# Patient Record
Sex: Female | Born: 1960 | Race: Black or African American | Hispanic: No | Marital: Married | Smoking: Former smoker
Health system: Southern US, Community
[De-identification: ages and names within clinical notes are randomized; demographics above are authoritative.]

## PROBLEM LIST (undated history)

## (undated) DIAGNOSIS — I10 Essential (primary) hypertension: Secondary | ICD-10-CM

## (undated) DIAGNOSIS — E119 Type 2 diabetes mellitus without complications: Secondary | ICD-10-CM

## (undated) HISTORY — PX: CHOLECYSTECTOMY: SHX55

## (undated) HISTORY — DX: Essential (primary) hypertension: I10

## (undated) HISTORY — DX: Type 2 diabetes mellitus without complications: E11.9

## (undated) HISTORY — PX: APPENDECTOMY: SHX54

---

## 2004-01-15 HISTORY — PX: SHOULDER ARTHROSCOPY W/ ROTATOR CUFF REPAIR: SHX2400

## 2011-02-26 ENCOUNTER — Ambulatory Visit: Payer: Self-pay | Admitting: Family Medicine

## 2014-12-21 ENCOUNTER — Encounter: Payer: Self-pay | Admitting: *Deleted

## 2014-12-21 ENCOUNTER — Encounter: Payer: BLUE CROSS/BLUE SHIELD | Attending: Family Medicine | Admitting: *Deleted

## 2014-12-21 ENCOUNTER — Telehealth: Payer: Self-pay | Admitting: *Deleted

## 2014-12-21 VITALS — BP 118/80 | Ht 62.0 in | Wt 153.6 lb

## 2014-12-21 DIAGNOSIS — E119 Type 2 diabetes mellitus without complications: Secondary | ICD-10-CM | POA: Insufficient documentation

## 2014-12-21 NOTE — Telephone Encounter (Signed)
Phone call made to Dr Clemmie Krill' office regarding pt's blood sugar of 399 mg/dL. Spoke with Sam. Asked her to fax A1C and pt's correct medication list. Also asked their office to call in prescription for strips and lancets for One Touch Ultra Mini meter. Received medication list and noted that patient is supposed to be on Metformin 2 x day. She reported only taking 1 x day. Phone call made to patient and left message regarding frequency.

## 2014-12-21 NOTE — Patient Instructions (Addendum)
Check blood sugars 2 x day before breakfast and 2 hrs after supper every day Exercise: Walk  for    45  minutes   4  days a week Avoid strenuous exercise until blood sugars less than 250 mg/dL Eat 3 meals day,   1  snacks a day Space meals 4-6 hours apart Don't skip meals Avoid sugar sweetened drinks (coffee, tea) Drink plenty of water Bring blood sugar records to the next class Call your doctor for a prescription for:  1. Meter strips (type) One Touch Ultra Blue checking  2  times per day  2. Lancets (type) One Touch Delica checking  2   times per day

## 2014-12-21 NOTE — Progress Notes (Addendum)
Diabetes Self-Management Education  Visit Type: First/Initial  Appt. Start Time: 0830 Appt. End Time: 0945  12/21/2014  [name], identified by name and date of birth, is a 54 y.o. female with a diagnosis of Diabetes: Type 2.   ASSESSMENT  Blood pressure 118/80, height 5\' 2"  (1.575 m), weight 153 lb 9.6 oz (69.673 kg). Body mass index is 28.09 kg/(m^2).      Diabetes Self-Management Education - 12/21/14 1044    Visit Information   Visit Type First/Initial   Initial Visit   Diabetes Type Type 2   Are you currently following a meal plan? No   Are you taking your medications as prescribed? Yes  Pt can't remember names of medications   Date Diagnosed 1 month ago   Health Coping   How would you rate your overall health? Good   Psychosocial Assessment   Patient Belief/Attitude about Diabetes Afraid  "not great" Pt was in process of getting tested to donate a kidney to her husband.    Self-care barriers None   Self-management support Doctor's office;Friends   Patient Concerns Nutrition/Meal planning;Monitoring;Glycemic Control;Medication   Special Needs None   Preferred Learning Style Auditory;Visual;Hands on   Learning Readiness Ready   How often do you need to have someone help you when you read instructions, pamphlets, or other written materials from your doctor or pharmacy? 1 - Never   What is the last grade level you completed in school? 2 years college   Complications   Last HgC A1C per patient/outside source 12.9%  11/29/14    How often do you check your blood sugar?  0 times/day (not testing)  Provided One Touch Ultra Mini meter and instructed on use. BG upon return demonstration was 399 mg/dL at 9:35 am - 3 1/2 hrs pp.    Have you had a dilated eye exam in the past 12 months? Yes   Have you had a dental exam in the past 12 months? No   Are you checking your feet? Yes   How many days per week are you checking your feet? 4   Dietary Intake   Breakfast only  eats 2 meals/day - works 3rd shift   Lunch 11 pm - chicken or beef with mixed vegetables   Dinner 4 am - same as lunch   Beverage(s) water, coffee with sugar   Exercise   Exercise Type Moderate (swimming / aerobic walking)   How many days per week to you exercise? 4   How many minutes per day do you exercise? 45   Total minutes per week of exercise 180   Patient Education   Previous Diabetes Education No   Disease state  Definition of diabetes, type 1 and 2, and the diagnosis of diabetes;Factors that contribute to the development of diabetes   Nutrition management  Role of diet in the treatment of diabetes and the relationship between the three main macronutrients and blood glucose level   Physical activity and exercise  Role of exercise on diabetes management, blood pressure control and cardiac health.   Medications Reviewed patients medication for diabetes, action, purpose, timing of dose and side effects.   Monitoring Taught/evaluated SMBG meter.;Purpose and frequency of SMBG.;Identified appropriate SMBG and/or A1C goals.   Chronic complications Relationship between chronic complications and blood glucose control   Psychosocial adjustment Role of stress on diabetes;Identified and addressed patients feelings and concerns about diabetes   Individualized Goals (developed by patient)   Reducing Risk Improve blood sugars Decrease  medications Prevent diabetes complications   Outcomes   Expected Outcomes Demonstrated interest in learning. Expect positive outcomes      Individualized Plan for Diabetes Self-Management Training:   Learning Objective:  Patient will have a greater understanding of diabetes self-management. Patient education plan is to attend individual and/or group sessions per assessed needs and concerns.   Plan:   Patient Instructions  Check blood sugars 2 x day before breakfast and 2 hrs after supper every day Exercise: Walk  for    45  minutes   4  days a week Avoid  strenuous exercise until blood sugars less than 250 mg/dL Eat 3 meals day,   1  snacks a day Space meals 4-6 hours apart Don't skip meals Avoid sugar sweetened drinks (coffee, tea) Drink plenty of water Bring blood sugar records to the next class Call your doctor for a prescription for:  1. Meter strips (type) One Touch Ultra Blue checking  2  times per day  2. Lancets (type) One Touch Delica checking  2   times per day   Expected Outcomes:  Demonstrated interest in learning. Expect positive outcomes  Education material provided:  General Meal Planning Guidelines Simple Meal Plan Meter - One Touch Ultra Mini  If problems or questions, patient to contact team via:   Johny Drilling, RN, CCM, CDE [phone]  Future DSME appointment:   Pt to call back to schedule classes. 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

## 2015-01-04 ENCOUNTER — Ambulatory Visit (INDEPENDENT_AMBULATORY_CARE_PROVIDER_SITE_OTHER): Payer: BLUE CROSS/BLUE SHIELD | Admitting: Podiatry

## 2015-01-04 ENCOUNTER — Encounter: Payer: Self-pay | Admitting: Podiatry

## 2015-01-04 ENCOUNTER — Ambulatory Visit (INDEPENDENT_AMBULATORY_CARE_PROVIDER_SITE_OTHER): Payer: BLUE CROSS/BLUE SHIELD

## 2015-01-04 DIAGNOSIS — M722 Plantar fascial fibromatosis: Secondary | ICD-10-CM | POA: Diagnosis not present

## 2015-01-04 MED ORDER — MELOXICAM 15 MG PO TABS: 15.0000 mg | ORAL_TABLET | Freq: Every day | ORAL | Status: DC

## 2015-01-04 NOTE — Progress Notes (Signed)
   Subjective:    Patient ID: Holly Cross, female    DOB: August 14, 1960, 54 y.o.   MRN: [id number]  HPI I HAVE SOME HEEL PAIN ON BOTH OF MY FEET AND HAS BEEN GOING ON SINCE July AND I CAN NOT WEAR HEELS AND THEY ARE SORE AND TENDER AND I HAVE CRAMPS IN THEM    Review of Systems  All other systems reviewed and are negative.      Objective:   Physical Exam: I have reviewed her past history medications allergies surgeries and social history. She presents today and distress. Pulses are strongly palpable. Neurologic sensorium is intact per Semmes-Weinstein monofilament. Deep tendon reflexes are intact bilateral and muscle strength +5 over 5 dorsiflexion and plantar flexors inverters everters all intrinsic musculature is intact. Orthopedic evaluation and x-rays all joints distal to the ankle range of motion without crepitation. Mild hallux valgus deformity hammertoe deformities noted bilateral brace symptomatically. Positive pain on palpation medial calcaneal tubercles bilateral no pain on medial and lateral compression of the calcaneus bilaterally. Cutaneous evaluation demonstrates supple well-hydrated cutis no erythema edema cellulitis drainage or odor. Radiographic evaluation today 3 views bilateral foot does demonstrate plantar fasciitis as a soft tissue increase in density at the plantar fascial calcaneal insertion site. No major fractures or derangement.        Assessment & Plan:  Assessment: Non-insulin-dependent diabetes mellitus. Plantar fasciitis bilateral.  Plan: Discussed etiology and pathology conservative versus surgical therapies. Started her on meloxicam 15 mg 1 by mouth daily. Injected the bilateral heels today with Kenalog and local anesthetic. Placed her in a plantar fascial brace bilateral. Place her in a night splint right foot. She was also scanned for set of orthotics today. I will follow up with her once those come in shoes that she have questions or concerns she will  notifies immediately. We also discussed appropriate shoe gear stretching exercises ice therapy and shoe gear modifications.

## 2015-01-06 ENCOUNTER — Telehealth: Payer: Self-pay | Admitting: *Deleted

## 2015-01-06 NOTE — Telephone Encounter (Signed)
Phone call to patient for follow up on blood sugars. She reports taking Metformin 500 mg bid. Her blood sugars are 300-400's mg/dL. She reports not testing her blood sugar since 4 days ago because she gets Error 5 message on her machine. Error indicates problem with strip or blood doesn't fill strip completely. Reviewed process over phone. Instructed her to come by office today to check meter. She reports she will come after her 2:30 pm eye appointment. She hasn't contacted MD office regarding high blood sugars. Called Dr Clemmie Krill' office and spoke with Erasmo Downer. Left message regarding pt's high blood sugars.

## 2015-02-01 ENCOUNTER — Encounter: Payer: Self-pay | Admitting: Podiatry

## 2015-02-01 ENCOUNTER — Ambulatory Visit (INDEPENDENT_AMBULATORY_CARE_PROVIDER_SITE_OTHER): Payer: BLUE CROSS/BLUE SHIELD | Admitting: Podiatry

## 2015-02-01 DIAGNOSIS — M2040 Other hammer toe(s) (acquired), unspecified foot: Secondary | ICD-10-CM

## 2015-02-01 DIAGNOSIS — M722 Plantar fascial fibromatosis: Secondary | ICD-10-CM | POA: Diagnosis not present

## 2015-02-01 DIAGNOSIS — Q828 Other specified congenital malformations of skin: Secondary | ICD-10-CM

## 2015-02-01 NOTE — Progress Notes (Signed)
She presents today for follow-up of her plantar fasciitis. He states that she's doing some better but she's not well. She is also complaining of painful lesions plantar aspect of the bilateral foot.  Objective: Vital signs are stable alert and oriented 3. She has strong palpable pulses. No calf pain. She is pain on palpation medial calcaneal tubercles bilateral. Multiple porokeratosis plantar aspect of the bilateral foot. These were debrided today without iatrogenic issues.  Assessment: Porokeratosis and plantar fasciitis.  Plan: I injected her second dose of Kenalog to the bilateral heels today and debridement all reactive hyperkeratoses suggested that she continue all conservative therapies and I will follow-up with her in 1 month.

## 2015-02-08 ENCOUNTER — Encounter: Payer: Self-pay | Admitting: *Deleted

## 2015-02-08 NOTE — Progress Notes (Signed)
Pt didn't come by office to review meter and she didn't call back to schedule classes. Will discharge.

## 2015-02-15 ENCOUNTER — Ambulatory Visit: Payer: BLUE CROSS/BLUE SHIELD

## 2015-02-24 ENCOUNTER — Ambulatory Visit (INDEPENDENT_AMBULATORY_CARE_PROVIDER_SITE_OTHER): Payer: BLUE CROSS/BLUE SHIELD | Admitting: Podiatry

## 2015-02-24 DIAGNOSIS — M722 Plantar fascial fibromatosis: Secondary | ICD-10-CM

## 2015-02-24 NOTE — Addendum Note (Signed)
Addended by: Clovis Riley E on: 02/24/2015 11:37 AM   Modules accepted: Miquel Dunn

## 2015-02-24 NOTE — Progress Notes (Signed)
Dispensed patient's orthotics with oral and written instructions for wearing. Patient will follow up with Dr. Hyatt in 1 month for an orthotic check. 

## 2015-02-24 NOTE — Patient Instructions (Signed)

## 2015-04-10 ENCOUNTER — Ambulatory Visit: Payer: BLUE CROSS/BLUE SHIELD | Admitting: Podiatry

## 2015-05-01 ENCOUNTER — Ambulatory Visit (INDEPENDENT_AMBULATORY_CARE_PROVIDER_SITE_OTHER): Payer: BLUE CROSS/BLUE SHIELD | Admitting: Podiatry

## 2015-05-01 ENCOUNTER — Encounter: Payer: Self-pay | Admitting: Podiatry

## 2015-05-01 VITALS — BP 124/84 | HR 89 | Resp 16

## 2015-05-01 DIAGNOSIS — Q828 Other specified congenital malformations of skin: Secondary | ICD-10-CM

## 2015-05-01 DIAGNOSIS — M722 Plantar fascial fibromatosis: Secondary | ICD-10-CM

## 2015-05-01 DIAGNOSIS — M2012 Hallux valgus (acquired), left foot: Secondary | ICD-10-CM

## 2015-05-01 NOTE — Progress Notes (Signed)
She presents today for follow-up of her orthotics. She states that it's like she is not even wearing her orthotics her feet still hurt regularly. She's also complaining about a bunion deformity to the left foot. States that this become more painful over the past several months and is affecting her ability to perform her daily duties as are her painful heels.  Objective: Vital signs are stable she is alert and oriented 3. Pulses are strongly palpable. Hallux abductovalgus is present to the left foot. She still has pain on palpation medial calcaneal tubercle bilaterally.  Assessment: Chronic intractable plantar fasciitis bilateral. Hallux valgus left foot.  Plan: Reinjected the bilateral heel today with Kenalog and local anesthetic continue use of her orthotics. She will follow up with me for surgical consult in the future for endoscopic plantar fasciotomy and a [facility] bunion repair left foot. At that time we will need to get another set of x-rays of the left foot.

## 2015-07-03 ENCOUNTER — Encounter: Payer: Self-pay | Admitting: Podiatry

## 2015-07-03 ENCOUNTER — Ambulatory Visit (INDEPENDENT_AMBULATORY_CARE_PROVIDER_SITE_OTHER): Payer: BLUE CROSS/BLUE SHIELD | Admitting: Podiatry

## 2015-07-03 ENCOUNTER — Ambulatory Visit (INDEPENDENT_AMBULATORY_CARE_PROVIDER_SITE_OTHER): Payer: BLUE CROSS/BLUE SHIELD

## 2015-07-03 VITALS — BP 132/88 | HR 69 | Resp 12

## 2015-07-03 DIAGNOSIS — M2012 Hallux valgus (acquired), left foot: Secondary | ICD-10-CM

## 2015-07-03 DIAGNOSIS — M722 Plantar fascial fibromatosis: Secondary | ICD-10-CM

## 2015-07-03 NOTE — Patient Instructions (Signed)

## 2015-07-03 NOTE — Progress Notes (Signed)
She presents today with a chief complaint of a painful bunion deformity to her left foot. She states that has been bothering her for quite some time. She states it is starting to affect her ability to perform her daily activities and initially she thought it may be related to plantar fasciitis however that has gone on to resolve and she still has pain. She is also complaining of a painful hammertoe deformity fifth right which is exquisitely painful with shoe gear.  Objective: I have reviewed her past medical history medications allergies surgery social history and review of systems. Vital signs are stable she is alert and oriented 3 pulse remained strong palpable bilateral. 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 Radiographs taken today confirm this. 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  Assessment: Diabetes mellitus hallux abductovalgus deformity left foot. 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  Plan: Discussed etiology and pathology and surgery for surgical therapies. We consented her today for an [facility] bunion repair with screw fixation as well as an arthroplasty fifth digit right foot. Answered all the questions regarding this procedure to the best of my ability in layman's terms. She understands and is amenable to it signed all 3 pages of the consent form. We did also discuss possible postop complications which may include but are not limited to postoperative pain bleeding swelling infection recurrence and need for further surgery overcorrection and under correction loss of digit loss of limb loss of life.

## 2016-03-14 DIAGNOSIS — I219 Acute myocardial infarction, unspecified: Secondary | ICD-10-CM

## 2016-03-14 HISTORY — PX: CARDIAC CATHETERIZATION: SHX172

## 2016-03-14 HISTORY — DX: Acute myocardial infarction, unspecified: I21.9

## 2016-03-30 DIAGNOSIS — I10 Essential (primary) hypertension: Secondary | ICD-10-CM | POA: Insufficient documentation

## 2016-03-30 DIAGNOSIS — I214 Non-ST elevation (NSTEMI) myocardial infarction: Secondary | ICD-10-CM | POA: Insufficient documentation

## 2016-03-30 DIAGNOSIS — E119 Type 2 diabetes mellitus without complications: Secondary | ICD-10-CM | POA: Insufficient documentation

## 2016-04-15 ENCOUNTER — Encounter: Payer: BLUE CROSS/BLUE SHIELD | Attending: Cardiovascular Disease

## 2016-04-15 DIAGNOSIS — I214 Non-ST elevation (NSTEMI) myocardial infarction: Secondary | ICD-10-CM | POA: Insufficient documentation

## 2016-05-06 ENCOUNTER — Encounter: Payer: BLUE CROSS/BLUE SHIELD | Admitting: *Deleted

## 2016-05-06 VITALS — Ht 63.4 in | Wt 153.2 lb

## 2016-05-06 DIAGNOSIS — I214 Non-ST elevation (NSTEMI) myocardial infarction: Secondary | ICD-10-CM | POA: Diagnosis present

## 2016-05-06 DIAGNOSIS — Z955 Presence of coronary angioplasty implant and graft: Secondary | ICD-10-CM

## 2016-05-06 DIAGNOSIS — I2129 ST elevation (STEMI) myocardial infarction involving other sites: Secondary | ICD-10-CM

## 2016-05-06 NOTE — Patient Instructions (Signed)
Patient Instructions  Patient Details  Name: Holly Cross MRN: [id number] Date of Birth: 07-18-1960 Referring Provider:  Frankey Poot, MD  Below are the personal goals you chose as well as exercise and nutrition goals. Our goal is to help you keep on track towards obtaining and maintaining your goals. We will be discussing your progress on these goals with you throughout the program.  Initial Exercise Prescription:     Initial Exercise Prescription - 05/06/16 1400      Date of Initial Exercise RX and Referring Provider   Referring Provider Hinderliter, Alan MD     Treadmill   MPH 2.6   Grade 0.5   Minutes 15   METs 3.17     Elliptical   Level 1   Speed 4   Minutes 15     T5 Nustep   Level 3   SPM 80   Minutes 15   METs 2     Prescription Details   Frequency (times per week) 2   Duration Progress to 45 minutes of aerobic exercise without signs/symptoms of physical distress     Intensity   THRR 40-80% of Max Heartrate 112-149   Ratings of Perceived Exertion 11-13   Perceived Dyspnea 0-4     Progression   Progression Continue to progress workloads to maintain intensity without signs/symptoms of physical distress.     Resistance Training   Training Prescription Yes   Weight 3 lbs   Reps 10-15      Exercise Goals: Frequency: Be able to perform aerobic exercise three times per week working toward 3-5 days per week.  Intensity: Work with a perceived exertion of 11 (fairly light) - 15 (hard) as tolerated. Follow your new exercise prescription and watch for changes in prescription as you progress with the program. Changes will be reviewed with you when they are made.  Duration: You should be able to do 30 minutes of continuous aerobic exercise in addition to a 5 minute warm-up and a 5 minute cool-down routine.  Nutrition Goals: Your personal nutrition goals will be established when you do your nutrition analysis with the dietician.  The following are  nutrition guidelines to follow: Cholesterol < 200mg /day Sodium < 1500mg /day Fiber: Women over 50 yrs - 21 grams per day  Personal Goals:     Personal Goals and Risk Factors at Admission - 05/06/16 1326      Core Components/Risk Factors/Patient Goals on Admission    Weight Management Yes;Weight Loss   Intervention Weight Management: Develop a combined nutrition and exercise program designed to reach desired caloric intake, while maintaining appropriate intake of nutrient and fiber, sodium and fats, and appropriate energy expenditure required for the weight goal.;Weight Management: Provide education and appropriate resources to help participant work on and attain dietary goals.   Admit Weight 153 lb 3.2 oz (69.5 kg)   Goal Weight: Short Term 150 lb (68 kg)   Goal Weight: Long Term 145 lb (65.8 kg)   Expected Outcomes Short Term: Continue to assess and modify interventions until short term weight is achieved;Long Term: Adherence to nutrition and physical activity/exercise program aimed toward attainment of established weight goal;Weight Loss: Understanding of general recommendations for a balanced deficit meal plan, which promotes 1-2 lb weight loss per week and includes a negative energy balance of [phone] kcal/d;Understanding recommendations for meals to include 15-35% energy as protein, 25-35% energy from fat, 35-60% energy from carbohydrates, less than 200mg  of dietary cholesterol, 20-35 gm of total fiber daily;Understanding of  distribution of calorie intake throughout the day with the consumption of 4-5 meals/snacks   Tobacco Cessation Yes   Number of packs per day Quit March 2018 Smoked 1/2 pack for 20 years   Expected Outcomes Long Term: Complete abstinence from all tobacco products for at least 12 months from quit date.;Short Term: Will quit all tobacco product use, adhering to prevention of relapse plan.   Diabetes Yes  Newly diagnosed in November 2017   Intervention Provide education  about signs/symptoms and action to take for hypo/hyperglycemia.;Provide education about proper nutrition, including hydration, and aerobic/resistive exercise prescription along with prescribed medications to achieve blood glucose in normal ranges: Fasting glucose 65-99 mg/dL   Expected Outcomes Short Term: Participant verbalizes understanding of the signs/symptoms and immediate care of hyper/hypoglycemia, proper foot care and importance of medication, aerobic/resistive exercise and nutrition plan for blood glucose control.;Long Term: Attainment of HbA1C < 7%.   Hypertension Yes   Intervention Provide education on lifestyle modifcations including regular physical activity/exercise, weight management, moderate sodium restriction and increased consumption of fresh fruit, vegetables, and low fat dairy, alcohol moderation, and smoking cessation.;Monitor prescription use compliance.   Expected Outcomes Short Term: Continued assessment and intervention until BP is < 140/52mm HG in hypertensive participants. < 130/66mm HG in hypertensive participants with diabetes, heart failure or chronic kidney disease.;Long Term: Maintenance of blood pressure at goal levels.   Lipids Yes   Intervention Provide education and support for participant on nutrition & aerobic/resistive exercise along with prescribed medications to achieve LDL 70mg , HDL >40mg .   Expected Outcomes Short Term: Participant states understanding of desired cholesterol values and is compliant with medications prescribed. Participant is following exercise prescription and nutrition guidelines.;Long Term: Cholesterol controlled with medications as prescribed, with individualized exercise RX and with personalized nutrition plan. Value goals: LDL < 70mg , HDL > 40 mg.   Stress Yes  Her husband is on home dialysis, needs a kidney, is on disability. She works nights   Intervention Offer individual and/or small group education and counseling on adjustment to heart  disease, stress management and health-related lifestyle change. Teach and support self-help strategies.;Refer participants experiencing significant psychosocial distress to appropriate mental health specialists for further evaluation and treatment. When possible, include family members and significant others in education/counseling sessions.   Expected Outcomes Short Term: Participant demonstrates changes in health-related behavior, relaxation and other stress management skills, ability to obtain effective social support, and compliance with psychotropic medications if prescribed.;Long Term: Emotional wellbeing is indicated by absence of clinically significant psychosocial distress or social isolation.      Tobacco Use Initial Evaluation: History  Smoking Status  . Current Every Day Smoker  . Packs/day: 0.50  . Years: 30.00  . Types: Cigarettes  Smokeless Tobacco  . 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

## 2016-05-06 NOTE — Progress Notes (Signed)
Daily Session Note  Patient Details  Name: Holly Cross MRN: [id number] Date of Birth: 1960/04/19 Referring Provider:    Encounter Date: 05/06/2016  Check In:     Session Check In - 05/06/16 1319      Check-In   Location [facility]   Staff Present Heath Lark, RN, BSN, CCRP;Tamantha Saline, RN, Levie Heritage, MA, ACSM RCEP, Exercise Physiologist   Supervising physician immediately available to respond to emergencies See telemetry face sheet for immediately available ER MD   Medication changes reported     No   Fall or balance concerns reported    No   Tobacco Cessation Use Decreased  Quit 03/2016   Warm-up and Cool-down Performed as group-led instruction   Resistance Training Performed Yes   VAD Patient? No     Pain Assessment   Currently in Pain? No/denies         History  Smoking Status  . Current Every Day Smoker  . Packs/day: 0.50  . Years: 30.00  . Types: Cigarettes  Smokeless Tobacco  . Former Systems developer    Goals Met:  Personal goals reviewed  Goals Unmet:  Not Applicable  Comments:     Dr. Emily Filbert is Medical Director for [place] and [facility].

## 2016-05-06 NOTE — Progress Notes (Signed)
Cardiac Individual Treatment Plan  Patient Details  Name: Holly Cross MRN: [id number] Date of Birth: [birth year] Referring Provider:     Cardiac Rehab from 05/06/2016 in [facility] Cardiac and Pulmonary Rehab  Referring Provider  Hinderliter, Antony Haste MD      Initial Encounter Date:    Cardiac Rehab from 05/06/2016 in [facility] Cardiac and Pulmonary Rehab  Referring Provider  Hinderliter, Antony Haste MD      Visit Diagnosis: ST elevation myocardial infarction (STEMI) involving other coronary artery [facility])  Status post coronary artery stent placement  Patient's Home Medications on Admission:  Current Outpatient Prescriptions:  .  hydrochlorothiazide (HYDRODIURIL) 25 MG tablet, Take 25 mg by mouth daily as needed., Disp: , Rfl:  .  lisinopril (PRINIVIL,ZESTRIL) 5 MG tablet, Take 5 mg by mouth daily., Disp: , Rfl:  .  meloxicam (MOBIC) 15 MG tablet, Take 1 tablet (15 mg total) by mouth daily., Disp: 30 tablet, Rfl: 3 .  METFORMIN HCL PO, Take 500 mg by mouth 2 (two) times daily. , Disp: , Rfl:  .  METOPROLOL SUCCINATE ER PO, Take 200 mg by mouth daily., Disp: , Rfl:   Past Medical History: Past Medical History:  Diagnosis Date  . Diabetes mellitus without complication ([place])   . Hypertension     Tobacco Use: History  Smoking Status  . Current Every Day Smoker  . Packs/day: 0.50  . Years: 30.00  . Types: Cigarettes  Smokeless Tobacco  . 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       Exercise Target Goals:    Exercise Program Goal: Individual exercise prescription set with THRR, safety & activity barriers. Participant demonstrates ability to understand and report RPE using BORG scale, to self-measure pulse accurately, and to acknowledge the importance of the exercise prescription.  Exercise Prescription Goal: Starting with aerobic activity 30 plus minutes a day, 3 days per week for initial exercise prescription. Provide home  exercise prescription and guidelines that participant acknowledges understanding prior to discharge.  Activity Barriers & Risk Stratification:     Activity Barriers & Cardiac Risk Stratification - 05/06/16 1323      Activity Barriers & Cardiac Risk Stratification   Activity Barriers Deconditioning;History of Falls  passed out at least twice within last year, occasional dizziness   Cardiac Risk Stratification High      6 Minute Walk:     6 Minute Walk    Row Name 05/06/16 1447         6 Minute Walk   Phase Initial     Distance 1414 feet     Walk Time 6 minutes     # of Rest Breaks 0     MPH 2.68     METS 4.07     RPE 7     VO2 Peak 14.23     Symptoms No     Resting HR 75 bpm     Resting BP 140/80     Max Ex. HR 102 bpm     Max Ex. BP 138/76     2 Minute Post BP 126/74        Oxygen Initial Assessment:   Oxygen Re-Evaluation:   Oxygen Discharge (Final Oxygen Re-Evaluation):   Initial Exercise Prescription:     Initial Exercise Prescription - 05/06/16 1400      Date of Initial Exercise RX and Referring Provider   Referring Provider Hinderliter, Antony Haste MD  Treadmill   MPH 2.6   Grade 0.5   Minutes 15   METs 3.17     Elliptical   Level 1   Speed 4   Minutes 15     T5 Nustep   Level 3   SPM 80   Minutes 15   METs 2     Prescription Details   Frequency (times per week) 2   Duration Progress to 45 minutes of aerobic exercise without signs/symptoms of physical distress     Intensity   THRR 40-80% of Max Heartrate 112-149   Ratings of Perceived Exertion 11-13   Perceived Dyspnea 0-4     Progression   Progression Continue to progress workloads to maintain intensity without signs/symptoms of physical distress.     Resistance Training   Training Prescription Yes   Weight 3 lbs   Reps 10-15      Perform Capillary Blood Glucose checks as needed.  Exercise Prescription Changes:      Exercise Prescription Changes    Row Name 05/06/16  1400             Response to Exercise   Blood Pressure (Admit) 140/80       Blood Pressure (Exercise) 138/76       Blood Pressure (Exit) 126/74       Heart Rate (Admit) 75 bpm       Heart Rate (Exercise) 102 bpm       Heart Rate (Exit) 80 bpm       Oxygen Saturation (Admit) 99 %       Oxygen Saturation (Exercise) 100 %       Rating of Perceived Exertion (Exercise) 7       Symptoms none       Comments walk test results          Exercise Comments:   Exercise Goals and Review:      Exercise Goals    Row Name 05/06/16 1450             Exercise Goals   Increase Physical Activity Yes       Intervention Provide advice, education, support and counseling about physical activity/exercise needs.;Develop an individualized exercise prescription for aerobic and resistive training based on initial evaluation findings, risk stratification, comorbidities and participant's personal goals.       Expected Outcomes Achievement of increased cardiorespiratory fitness and enhanced flexibility, muscular endurance and strength shown through measurements of functional capacity and personal statement of participant.       Increase Strength and Stamina Yes       Intervention Provide advice, education, support and counseling about physical activity/exercise needs.;Develop an individualized exercise prescription for aerobic and resistive training based on initial evaluation findings, risk stratification, comorbidities and participant's personal goals.       Expected Outcomes Achievement of increased cardiorespiratory fitness and enhanced flexibility, muscular endurance and strength shown through measurements of functional capacity and personal statement of participant.          Exercise Goals Re-Evaluation :   Discharge Exercise Prescription (Final Exercise Prescription Changes):     Exercise Prescription Changes - 05/06/16 1400      Response to Exercise   Blood Pressure (Admit) 140/80    Blood Pressure (Exercise) 138/76   Blood Pressure (Exit) 126/74   Heart Rate (Admit) 75 bpm   Heart Rate (Exercise) 102 bpm   Heart Rate (Exit) 80 bpm   Oxygen Saturation (Admit) 99 %   Oxygen Saturation (Exercise) 100 %  Rating of Perceived Exertion (Exercise) 7   Symptoms none   Comments walk test results      Nutrition:  Target Goals: Understanding of nutrition guidelines, daily intake of sodium 1500mg , cholesterol 200mg , calories 30% from fat and 7% or less from saturated fats, daily to have 5 or more servings of fruits and vegetables.  Biometrics:     Pre Biometrics - 05/06/16 1450      Pre Biometrics   Height 5' 3.4" (1.61 m)   Weight 153 lb 3.2 oz (69.5 kg)   Waist Circumference 31 inches   Hip Circumference 38 inches   Waist to Hip Ratio 0.82 %   BMI (Calculated) 26.9   Single Leg Stand 30 seconds       Nutrition Therapy Plan and Nutrition Goals:     Nutrition Therapy & Goals - 05/06/16 1323      Nutrition Therapy   RD appointment defered Yes  Diabetes "I know I need to eat several small meals a day"   Drug/Food Interactions Statins/Certain Fruits      Nutrition Discharge: Rate Your Plate Scores:     Nutrition Assessments - 05/06/16 1325      MEDFICTS Scores   Pre Score 27      Nutrition Goals Re-Evaluation:   Nutrition Goals Discharge (Final Nutrition Goals Re-Evaluation):   Psychosocial: Target Goals: Acknowledge presence or absence of significant depression and/or stress, maximize coping skills, provide positive support system. Participant is able to verbalize types and ability to use techniques and skills needed for reducing stress and depression.   Initial Review & Psychosocial Screening:     Initial Psych Review & Screening - 05/06/16 1328      Initial Review   Current issues with Current Stress Concerns   Source of Stress Concerns Family;Chronic Illness   Comments Treyana mentioned that her husband needs a kidney and she was  willing to donate one until the doctor told her that she could not since she was diagnosed with diabetes after a 30lbs weight loss in one month Nov 2017. She said her husband is on diability and they have a lot of boxes in their home since he is on home dialysis.       Quality of Life Scores:      Quality of Life - 05/06/16 1350      Quality of Life Scores   Health/Function Pre 21.6 %   Socioeconomic Pre 25.71 %   Psych/Spiritual Pre 26.57 %   Family Pre 18 %   GLOBAL Pre 22.94 %      PHQ-9: Recent Review Flowsheet Data    Depression screen [facility] 2/9 05/06/2016 12/21/2014   Decreased Interest 0 0   Down, Depressed, Hopeless 0 0   PHQ - 2 Score 0 0   Altered sleeping 0 -   Tired, decreased energy 1 -   Change in appetite 1 -   Feeling bad or failure about yourself  0 -   Trouble concentrating 0 -   Moving slowly or fidgety/restless 0 -   Suicidal thoughts 0 -   PHQ-9 Score 2 -   Difficult doing work/chores Somewhat difficult -     Interpretation of Total Score  Total Score Depression Severity:  1-4 = Minimal depression, 5-9 = Mild depression, 10-14 = Moderate depression, 15-19 = Moderately severe depression, 20-27 = Severe depression   Psychosocial Evaluation and Intervention:   Psychosocial Re-Evaluation:   Psychosocial Discharge (Final Psychosocial Re-Evaluation):   Vocational Rehabilitation: Provide vocational  rehab assistance to qualifying candidates.   Vocational Rehab Evaluation & Intervention:     Vocational Rehab - 05/06/16 1319      Initial Vocational Rehab Evaluation & Intervention   Assessment shows need for Vocational Rehabilitation No      Education: Education Goals: Education classes will be provided on a weekly basis, covering required topics. Participant will state understanding/return demonstration of topics presented.  Learning Barriers/Preferences:     Learning Barriers/Preferences - 05/06/16 1317      Learning Barriers/Preferences    Learning Barriers None   Learning Preferences Skilled Demonstration;Verbal Instruction      Education Topics: General Nutrition Guidelines/Fats and Fiber: -Group instruction provided by verbal, written material, models and posters to present the general guidelines for heart healthy nutrition. Gives an explanation and review of dietary fats and fiber.   Controlling Sodium/Reading Food Labels: -Group verbal and written material supporting the discussion of sodium use in heart healthy nutrition. Review and explanation with models, verbal and written materials for utilization of the food label.   Exercise Physiology & Risk Factors: - Group verbal and written instruction with models to review the exercise physiology of the cardiovascular system and associated critical values. Details cardiovascular disease risk factors and the goals associated with each risk factor.   Aerobic Exercise & Resistance Training: - Gives group verbal and written discussion on the health impact of inactivity. On the components of aerobic and resistive training programs and the benefits of this training and how to safely progress through these programs.   Flexibility, Balance, General Exercise Guidelines: - Provides group verbal and written instruction on the benefits of flexibility and balance training programs. Provides general exercise guidelines with specific guidelines to those with heart or lung disease. Demonstration and skill practice provided.   Stress Management: - Provides group verbal and written instruction about the health risks of elevated stress, cause of high stress, and healthy ways to reduce stress.   Depression: - Provides group verbal and written instruction on the correlation between heart/lung disease and depressed mood, treatment options, and the stigmas associated with seeking treatment.   Anatomy & Physiology of the Heart: - Group verbal and written instruction and models provide basic  cardiac anatomy and physiology, with the coronary electrical and arterial systems. Review of: AMI, Angina, Valve disease, Heart Failure, Cardiac Arrhythmia, Pacemakers, and the ICD.   Cardiac Procedures: - Group verbal and written instruction and models to describe the testing methods done to diagnose heart disease. Reviews the outcomes of the test results. Describes the treatment choices: Medical Management, Angioplasty, or Coronary Bypass Surgery.   Cardiac Medications: - Group verbal and written instruction to review commonly prescribed medications for heart disease. Reviews the medication, class of the drug, and side effects. Includes the steps to properly store meds and maintain the prescription regimen.   Go Sex-Intimacy & Heart Disease, Get SMART - Goal Setting: - Group verbal and written instruction through game format to discuss heart disease and the return to sexual intimacy. Provides group verbal and written material to discuss and apply goal setting through the application of the S.M.A.R.T. Method.   Other Matters of the Heart: - Provides group verbal, written materials and models to describe Heart Failure, Angina, Valve Disease, and Diabetes in the realm of heart disease. Includes description of the disease process and treatment options available to the cardiac patient.   Exercise & Equipment Safety: - Individual verbal instruction and demonstration of equipment use and safety with use of the equipment.  Cardiac Rehab from 05/06/2016 in [facility] Cardiac and Pulmonary Rehab  Date  05/06/16  Educator  C. EnterkinRN  Instruction Review Code  1- partially meets, needs review/practice      Infection Prevention: - Provides verbal and written material to individual with discussion of infection control including proper hand washing and proper equipment cleaning during exercise session.   Cardiac Rehab from 05/06/2016 in [facility] Cardiac and Pulmonary Rehab  Date  05/06/16  Educator  C.  Amad Mau, RN  Instruction Review Code  2- meets goals/outcomes      Falls Prevention: - Provides verbal and written material to individual with discussion of falls prevention and safety.   Cardiac Rehab from 05/06/2016 in [facility] Cardiac and Pulmonary Rehab  Date  05/06/16  Educator  C. [place]  Instruction Review Code  2- meets goals/outcomes      Diabetes: - Individual verbal and written instruction to review signs/symptoms of diabetes, desired ranges of glucose level fasting, after meals and with exercise. Advice that pre and post exercise glucose checks will be done for 3 sessions at entry of program.   Cardiac Rehab from 05/06/2016 in [facility] Cardiac and Pulmonary Rehab  Date  05/06/16  Educator  Cindy Hazy, RN  Instruction Review Code  1- partially meets, needs review/practice       Knowledge Questionnaire Score:     Knowledge Questionnaire Score - 05/06/16 1318      Knowledge Questionnaire Score   Pre Score 17/28      Core Components/Risk Factors/Patient Goals at Admission:     Personal Goals and Risk Factors at Admission - 05/06/16 1326      Core Components/Risk Factors/Patient Goals on Admission    Weight Management Yes;Weight Loss   Intervention Weight Management: Develop a combined nutrition and exercise program designed to reach desired caloric intake, while maintaining appropriate intake of nutrient and fiber, sodium and fats, and appropriate energy expenditure required for the weight goal.;Weight Management: Provide education and appropriate resources to help participant work on and attain dietary goals.   Admit Weight 153 lb 3.2 oz (69.5 kg)   Goal Weight: Short Term 150 lb (68 kg)   Goal Weight: Long Term 145 lb (65.8 kg)   Expected Outcomes Short Term: Continue to assess and modify interventions until short term weight is achieved;Long Term: Adherence to nutrition and physical activity/exercise program aimed toward attainment of established weight goal;Weight  Loss: Understanding of general recommendations for a balanced deficit meal plan, which promotes 1-2 lb weight loss per week and includes a negative energy balance of [phone] kcal/d;Understanding recommendations for meals to include 15-35% energy as protein, 25-35% energy from fat, 35-60% energy from carbohydrates, less than 200mg  of dietary cholesterol, 20-35 gm of total fiber daily;Understanding of distribution of calorie intake throughout the day with the consumption of 4-5 meals/snacks   Tobacco Cessation Yes   Number of packs per day Quit March 2018 Smoked 1/2 pack for 20 years   Expected Outcomes Long Term: Complete abstinence from all tobacco products for at least 12 months from quit date.;Short Term: Will quit all tobacco product use, adhering to prevention of relapse plan.   Diabetes Yes  Newly diagnosed in November 2017   Intervention Provide education about signs/symptoms and action to take for hypo/hyperglycemia.;Provide education about proper nutrition, including hydration, and aerobic/resistive exercise prescription along with prescribed medications to achieve blood glucose in normal ranges: Fasting glucose 65-99 mg/dL   Expected Outcomes Short Term: Participant verbalizes understanding of the signs/symptoms and immediate care  of hyper/hypoglycemia, proper foot care and importance of medication, aerobic/resistive exercise and nutrition plan for blood glucose control.;Long Term: Attainment of HbA1C < 7%.   Hypertension Yes   Intervention Provide education on lifestyle modifcations including regular physical activity/exercise, weight management, moderate sodium restriction and increased consumption of fresh fruit, vegetables, and low fat dairy, alcohol moderation, and smoking cessation.;Monitor prescription use compliance.   Expected Outcomes Short Term: Continued assessment and intervention until BP is < 140/62mm HG in hypertensive participants. < 130/18mm HG in hypertensive participants with  diabetes, heart failure or chronic kidney disease.;Long Term: Maintenance of blood pressure at goal levels.   Lipids Yes   Intervention Provide education and support for participant on nutrition & aerobic/resistive exercise along with prescribed medications to achieve LDL 70mg , HDL >40mg .   Expected Outcomes Short Term: Participant states understanding of desired cholesterol values and is compliant with medications prescribed. Participant is following exercise prescription and nutrition guidelines.;Long Term: Cholesterol controlled with medications as prescribed, with individualized exercise RX and with personalized nutrition plan. Value goals: LDL < 70mg , HDL > 40 mg.   Stress Yes  Her husband is on home dialysis, needs a kidney, is on disability. She works nights   Intervention Offer individual and/or small group education and counseling on adjustment to heart disease, stress management and health-related lifestyle change. Teach and support self-help strategies.;Refer participants experiencing significant psychosocial distress to appropriate mental health specialists for further evaluation and treatment. When possible, include family members and significant others in education/counseling sessions.   Expected Outcomes Short Term: Participant demonstrates changes in health-related behavior, relaxation and other stress management skills, ability to obtain effective social support, and compliance with psychotropic medications if prescribed.;Long Term: Emotional wellbeing is indicated by absence of clinically significant psychosocial distress or social isolation.      Core Components/Risk Factors/Patient Goals Review:    Core Components/Risk Factors/Patient Goals at Discharge (Final Review):    ITP Comments:     ITP Comments    Row Name 05/06/16 1321 05/06/16 1324 05/06/16 1342       ITP Comments ITP Created During Medical Review. Documentation of Diagnosis 03/30/2016 [facility] Encounter Juni said  she knows she needs to eat several small meals a day but she said she works nights. She does not like nab crackers. Anakaren said she gets up at 5pm to go to work and she does not like to eat in her car.  ITP was created during Medical review. Documentation of dix [facility] 03/30/2016 note.        Comments: Initial ITP

## 2016-05-08 ENCOUNTER — Encounter: Payer: Self-pay | Admitting: *Deleted

## 2016-05-08 DIAGNOSIS — I2129 ST elevation (STEMI) myocardial infarction involving other sites: Secondary | ICD-10-CM

## 2016-05-08 DIAGNOSIS — Z955 Presence of coronary angioplasty implant and graft: Secondary | ICD-10-CM

## 2016-05-08 NOTE — Progress Notes (Signed)
Cardiac Individual Treatment Plan  Patient Details  Name: Winefred Hillesheim MRN: [id number] Date of Birth: 1961/01/02 Referring Provider:     Cardiac Rehab from 05/06/2016 in [facility] Cardiac and Pulmonary Rehab  Referring Provider  Hinderliter, Antony Haste MD      Initial Encounter Date:    Cardiac Rehab from 05/06/2016 in [facility] Cardiac and Pulmonary Rehab  Referring Provider  Hinderliter, Antony Haste MD      Visit Diagnosis: ST elevation myocardial infarction (STEMI) involving other coronary artery [facility])  Status post coronary artery stent placement  Patient's Home Medications on Admission:  Current Outpatient Prescriptions:  .  hydrochlorothiazide (HYDRODIURIL) 25 MG tablet, Take 25 mg by mouth daily as needed., Disp: , Rfl:  .  lisinopril (PRINIVIL,ZESTRIL) 5 MG tablet, Take 5 mg by mouth daily., Disp: , Rfl:  .  meloxicam (MOBIC) 15 MG tablet, Take 1 tablet (15 mg total) by mouth daily., Disp: 30 tablet, Rfl: 3 .  METFORMIN HCL PO, Take 500 mg by mouth 2 (two) times daily. , Disp: , Rfl:  .  METOPROLOL SUCCINATE ER PO, Take 200 mg by mouth daily., Disp: , Rfl:   Past Medical History: Past Medical History:  Diagnosis Date  . Diabetes mellitus without complication ([place])   . Hypertension     Tobacco Use: History  Smoking Status  . Current Every Day Smoker  . Packs/day: 0.50  . Years: 30.00  . Types: Cigarettes  Smokeless Tobacco  . 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       Exercise Target Goals:    Exercise Program Goal: Individual exercise prescription set with THRR, safety & activity barriers. Participant demonstrates ability to understand and report RPE using BORG scale, to self-measure pulse accurately, and to acknowledge the importance of the exercise prescription.  Exercise Prescription Goal: Starting with aerobic activity 30 plus minutes a day, 3 days per week for initial exercise prescription. Provide home  exercise prescription and guidelines that participant acknowledges understanding prior to discharge.  Activity Barriers & Risk Stratification:     Activity Barriers & Cardiac Risk Stratification - 05/06/16 1323      Activity Barriers & Cardiac Risk Stratification   Activity Barriers Deconditioning;History of Falls  passed out at least twice within last year, occasional dizziness   Cardiac Risk Stratification High      6 Minute Walk:     6 Minute Walk    Row Name 05/06/16 1447         6 Minute Walk   Phase Initial     Distance 1414 feet     Walk Time 6 minutes     # of Rest Breaks 0     MPH 2.68     METS 4.07     RPE 7     VO2 Peak 14.23     Symptoms No     Resting HR 75 bpm     Resting BP 140/80     Max Ex. HR 102 bpm     Max Ex. BP 138/76     2 Minute Post BP 126/74        Oxygen Initial Assessment:   Oxygen Re-Evaluation:   Oxygen Discharge (Final Oxygen Re-Evaluation):   Initial Exercise Prescription:     Initial Exercise Prescription - 05/06/16 1400      Date of Initial Exercise RX and Referring Provider   Referring Provider Hinderliter, Antony Haste MD  Treadmill   MPH 2.6   Grade 0.5   Minutes 15   METs 3.17     Elliptical   Level 1   Speed 4   Minutes 15     T5 Nustep   Level 3   SPM 80   Minutes 15   METs 2     Prescription Details   Frequency (times per week) 2   Duration Progress to 45 minutes of aerobic exercise without signs/symptoms of physical distress     Intensity   THRR 40-80% of Max Heartrate 112-149   Ratings of Perceived Exertion 11-13   Perceived Dyspnea 0-4     Progression   Progression Continue to progress workloads to maintain intensity without signs/symptoms of physical distress.     Resistance Training   Training Prescription Yes   Weight 3 lbs   Reps 10-15      Perform Capillary Blood Glucose checks as needed.  Exercise Prescription Changes:     Exercise Prescription Changes    Row Name 05/06/16  1400             Response to Exercise   Blood Pressure (Admit) 140/80       Blood Pressure (Exercise) 138/76       Blood Pressure (Exit) 126/74       Heart Rate (Admit) 75 bpm       Heart Rate (Exercise) 102 bpm       Heart Rate (Exit) 80 bpm       Oxygen Saturation (Admit) 99 %       Oxygen Saturation (Exercise) 100 %       Rating of Perceived Exertion (Exercise) 7       Symptoms none       Comments walk test results          Exercise Comments:   Exercise Goals and Review:     Exercise Goals    Row Name 05/06/16 1450             Exercise Goals   Increase Physical Activity Yes       Intervention Provide advice, education, support and counseling about physical activity/exercise needs.;Develop an individualized exercise prescription for aerobic and resistive training based on initial evaluation findings, risk stratification, comorbidities and participant's personal goals.       Expected Outcomes Achievement of increased cardiorespiratory fitness and enhanced flexibility, muscular endurance and strength shown through measurements of functional capacity and personal statement of participant.       Increase Strength and Stamina Yes       Intervention Provide advice, education, support and counseling about physical activity/exercise needs.;Develop an individualized exercise prescription for aerobic and resistive training based on initial evaluation findings, risk stratification, comorbidities and participant's personal goals.       Expected Outcomes Achievement of increased cardiorespiratory fitness and enhanced flexibility, muscular endurance and strength shown through measurements of functional capacity and personal statement of participant.          Exercise Goals Re-Evaluation :   Discharge Exercise Prescription (Final Exercise Prescription Changes):     Exercise Prescription Changes - 05/06/16 1400      Response to Exercise   Blood Pressure (Admit) 140/80   Blood  Pressure (Exercise) 138/76   Blood Pressure (Exit) 126/74   Heart Rate (Admit) 75 bpm   Heart Rate (Exercise) 102 bpm   Heart Rate (Exit) 80 bpm   Oxygen Saturation (Admit) 99 %   Oxygen Saturation (Exercise) 100 %  Rating of Perceived Exertion (Exercise) 7   Symptoms none   Comments walk test results      Nutrition:  Target Goals: Understanding of nutrition guidelines, daily intake of sodium 1500mg , cholesterol 200mg , calories 30% from fat and 7% or less from saturated fats, daily to have 5 or more servings of fruits and vegetables.  Biometrics:     Pre Biometrics - 05/06/16 1450      Pre Biometrics   Height 5' 3.4" (1.61 m)   Weight 153 lb 3.2 oz (69.5 kg)   Waist Circumference 31 inches   Hip Circumference 38 inches   Waist to Hip Ratio 0.82 %   BMI (Calculated) 26.9   Single Leg Stand 30 seconds       Nutrition Therapy Plan and Nutrition Goals:     Nutrition Therapy & Goals - 05/06/16 1323      Nutrition Therapy   RD appointment defered Yes  Diabetes "I know I need to eat several small meals a day"   Drug/Food Interactions Statins/Certain Fruits      Nutrition Discharge: Rate Your Plate Scores:     Nutrition Assessments - 05/06/16 1325      MEDFICTS Scores   Pre Score 27      Nutrition Goals Re-Evaluation:   Nutrition Goals Discharge (Final Nutrition Goals Re-Evaluation):   Psychosocial: Target Goals: Acknowledge presence or absence of significant depression and/or stress, maximize coping skills, provide positive support system. Participant is able to verbalize types and ability to use techniques and skills needed for reducing stress and depression.   Initial Review & Psychosocial Screening:     Initial Psych Review & Screening - 05/06/16 1328      Initial Review   Current issues with Current Stress Concerns   Source of Stress Concerns Family;Chronic Illness   Comments Ekta mentioned that her husband needs a kidney and she was willing to  donate one until the doctor told her that she could not since she was diagnosed with diabetes after a 30lbs weight loss in one month Nov 2017. She said her husband is on diability and they have a lot of boxes in their home since he is on home dialysis.       Quality of Life Scores:      Quality of Life - 05/06/16 1350      Quality of Life Scores   Health/Function Pre 21.6 %   Socioeconomic Pre 25.71 %   Psych/Spiritual Pre 26.57 %   Family Pre 18 %   GLOBAL Pre 22.94 %      PHQ-9: Recent Review Flowsheet Data    Depression screen [facility] 2/9 05/06/2016 12/21/2014   Decreased Interest 0 0   Down, Depressed, Hopeless 0 0   PHQ - 2 Score 0 0   Altered sleeping 0 -   Tired, decreased energy 1 -   Change in appetite 1 -   Feeling bad or failure about yourself  0 -   Trouble concentrating 0 -   Moving slowly or fidgety/restless 0 -   Suicidal thoughts 0 -   PHQ-9 Score 2 -   Difficult doing work/chores Somewhat difficult -     Interpretation of Total Score  Total Score Depression Severity:  1-4 = Minimal depression, 5-9 = Mild depression, 10-14 = Moderate depression, 15-19 = Moderately severe depression, 20-27 = Severe depression   Psychosocial Evaluation and Intervention:   Psychosocial Re-Evaluation:   Psychosocial Discharge (Final Psychosocial Re-Evaluation):   Vocational Rehabilitation: Provide vocational  rehab assistance to qualifying candidates.   Vocational Rehab Evaluation & Intervention:     Vocational Rehab - 05/06/16 1319      Initial Vocational Rehab Evaluation & Intervention   Assessment shows need for Vocational Rehabilitation No      Education: Education Goals: Education classes will be provided on a weekly basis, covering required topics. Participant will state understanding/return demonstration of topics presented.  Learning Barriers/Preferences:     Learning Barriers/Preferences - 05/06/16 1317      Learning Barriers/Preferences   Learning  Barriers None   Learning Preferences Skilled Demonstration;Verbal Instruction      Education Topics: General Nutrition Guidelines/Fats and Fiber: -Group instruction provided by verbal, written material, models and posters to present the general guidelines for heart healthy nutrition. Gives an explanation and review of dietary fats and fiber.   Controlling Sodium/Reading Food Labels: -Group verbal and written material supporting the discussion of sodium use in heart healthy nutrition. Review and explanation with models, verbal and written materials for utilization of the food label.   Exercise Physiology & Risk Factors: - Group verbal and written instruction with models to review the exercise physiology of the cardiovascular system and associated critical values. Details cardiovascular disease risk factors and the goals associated with each risk factor.   Aerobic Exercise & Resistance Training: - Gives group verbal and written discussion on the health impact of inactivity. On the components of aerobic and resistive training programs and the benefits of this training and how to safely progress through these programs.   Flexibility, Balance, General Exercise Guidelines: - Provides group verbal and written instruction on the benefits of flexibility and balance training programs. Provides general exercise guidelines with specific guidelines to those with heart or lung disease. Demonstration and skill practice provided.   Stress Management: - Provides group verbal and written instruction about the health risks of elevated stress, cause of high stress, and healthy ways to reduce stress.   Depression: - Provides group verbal and written instruction on the correlation between heart/lung disease and depressed mood, treatment options, and the stigmas associated with seeking treatment.   Anatomy & Physiology of the Heart: - Group verbal and written instruction and models provide basic cardiac  anatomy and physiology, with the coronary electrical and arterial systems. Review of: AMI, Angina, Valve disease, Heart Failure, Cardiac Arrhythmia, Pacemakers, and the ICD.   Cardiac Procedures: - Group verbal and written instruction and models to describe the testing methods done to diagnose heart disease. Reviews the outcomes of the test results. Describes the treatment choices: Medical Management, Angioplasty, or Coronary Bypass Surgery.   Cardiac Medications: - Group verbal and written instruction to review commonly prescribed medications for heart disease. Reviews the medication, class of the drug, and side effects. Includes the steps to properly store meds and maintain the prescription regimen.   Go Sex-Intimacy & Heart Disease, Get SMART - Goal Setting: - Group verbal and written instruction through game format to discuss heart disease and the return to sexual intimacy. Provides group verbal and written material to discuss and apply goal setting through the application of the S.M.A.R.T. Method.   Other Matters of the Heart: - Provides group verbal, written materials and models to describe Heart Failure, Angina, Valve Disease, and Diabetes in the realm of heart disease. Includes description of the disease process and treatment options available to the cardiac patient.   Exercise & Equipment Safety: - Individual verbal instruction and demonstration of equipment use and safety with use of the equipment.  Cardiac Rehab from 05/06/2016 in [facility] Cardiac and Pulmonary Rehab  Date  05/06/16  Educator  C. EnterkinRN  Instruction Review Code  1- partially meets, needs review/practice      Infection Prevention: - Provides verbal and written material to individual with discussion of infection control including proper hand washing and proper equipment cleaning during exercise session.   Cardiac Rehab from 05/06/2016 in [facility] Cardiac and Pulmonary Rehab  Date  05/06/16  Educator  C. Enterkin,  RN  Instruction Review Code  2- meets goals/outcomes      Falls Prevention: - Provides verbal and written material to individual with discussion of falls prevention and safety.   Cardiac Rehab from 05/06/2016 in [facility] Cardiac and Pulmonary Rehab  Date  05/06/16  Educator  C. Rothschild  Instruction Review Code  2- meets goals/outcomes      Diabetes: - Individual verbal and written instruction to review signs/symptoms of diabetes, desired ranges of glucose level fasting, after meals and with exercise. Advice that pre and post exercise glucose checks will be done for 3 sessions at entry of program.   Cardiac Rehab from 05/06/2016 in [facility] Cardiac and Pulmonary Rehab  Date  05/06/16  Educator  Cindy Hazy, RN  Instruction Review Code  1- partially meets, needs review/practice       Knowledge Questionnaire Score:     Knowledge Questionnaire Score - 05/06/16 1318      Knowledge Questionnaire Score   Pre Score 17/28      Core Components/Risk Factors/Patient Goals at Admission:     Personal Goals and Risk Factors at Admission - 05/06/16 1326      Core Components/Risk Factors/Patient Goals on Admission    Weight Management Yes;Weight Loss   Intervention Weight Management: Develop a combined nutrition and exercise program designed to reach desired caloric intake, while maintaining appropriate intake of nutrient and fiber, sodium and fats, and appropriate energy expenditure required for the weight goal.;Weight Management: Provide education and appropriate resources to help participant work on and attain dietary goals.   Admit Weight 153 lb 3.2 oz (69.5 kg)   Goal Weight: Short Term 150 lb (68 kg)   Goal Weight: Long Term 145 lb (65.8 kg)   Expected Outcomes Short Term: Continue to assess and modify interventions until short term weight is achieved;Long Term: Adherence to nutrition and physical activity/exercise program aimed toward attainment of established weight goal;Weight Loss:  Understanding of general recommendations for a balanced deficit meal plan, which promotes 1-2 lb weight loss per week and includes a negative energy balance of [phone] kcal/d;Understanding recommendations for meals to include 15-35% energy as protein, 25-35% energy from fat, 35-60% energy from carbohydrates, less than 200mg  of dietary cholesterol, 20-35 gm of total fiber daily;Understanding of distribution of calorie intake throughout the day with the consumption of 4-5 meals/snacks   Tobacco Cessation Yes   Number of packs per day Quit March 2018 Smoked 1/2 pack for 20 years   Expected Outcomes Long Term: Complete abstinence from all tobacco products for at least 12 months from quit date.;Short Term: Will quit all tobacco product use, adhering to prevention of relapse plan.   Diabetes Yes  Newly diagnosed in November 2017   Intervention Provide education about signs/symptoms and action to take for hypo/hyperglycemia.;Provide education about proper nutrition, including hydration, and aerobic/resistive exercise prescription along with prescribed medications to achieve blood glucose in normal ranges: Fasting glucose 65-99 mg/dL   Expected Outcomes Short Term: Participant verbalizes understanding of the signs/symptoms and immediate care  of hyper/hypoglycemia, proper foot care and importance of medication, aerobic/resistive exercise and nutrition plan for blood glucose control.;Long Term: Attainment of HbA1C < 7%.   Hypertension Yes   Intervention Provide education on lifestyle modifcations including regular physical activity/exercise, weight management, moderate sodium restriction and increased consumption of fresh fruit, vegetables, and low fat dairy, alcohol moderation, and smoking cessation.;Monitor prescription use compliance.   Expected Outcomes Short Term: Continued assessment and intervention until BP is < 140/86mm HG in hypertensive participants. < 130/64mm HG in hypertensive participants with  diabetes, heart failure or chronic kidney disease.;Long Term: Maintenance of blood pressure at goal levels.   Lipids Yes   Intervention Provide education and support for participant on nutrition & aerobic/resistive exercise along with prescribed medications to achieve LDL 70mg , HDL >40mg .   Expected Outcomes Short Term: Participant states understanding of desired cholesterol values and is compliant with medications prescribed. Participant is following exercise prescription and nutrition guidelines.;Long Term: Cholesterol controlled with medications as prescribed, with individualized exercise RX and with personalized nutrition plan. Value goals: LDL < 70mg , HDL > 40 mg.   Stress Yes  Her husband is on home dialysis, needs a kidney, is on disability. She works nights   Intervention Offer individual and/or small group education and counseling on adjustment to heart disease, stress management and health-related lifestyle change. Teach and support self-help strategies.;Refer participants experiencing significant psychosocial distress to appropriate mental health specialists for further evaluation and treatment. When possible, include family members and significant others in education/counseling sessions.   Expected Outcomes Short Term: Participant demonstrates changes in health-related behavior, relaxation and other stress management skills, ability to obtain effective social support, and compliance with psychotropic medications if prescribed.;Long Term: Emotional wellbeing is indicated by absence of clinically significant psychosocial distress or social isolation.      Core Components/Risk Factors/Patient Goals Review:    Core Components/Risk Factors/Patient Goals at Discharge (Final Review):    ITP Comments:     ITP Comments    Row Name 05/06/16 1321 05/06/16 1324 05/06/16 1342 05/08/16 0618     ITP Comments ITP Created During Medical Review. Documentation of Diagnosis 03/30/2016 [facility] Encounter  Eymi said she knows she needs to eat several small meals a day but she said she works nights. She does not like nab crackers. Yanelie said she gets up at 5pm to go to work and she does not like to eat in her car.  ITP was created during Medical review. Documentation of dix [facility] 03/30/2016 note. 30 day review. Continue with ITP unless directed changes per Medical Director review  New to program  has attended medical review       Comments:

## 2016-05-21 ENCOUNTER — Encounter: Payer: BLUE CROSS/BLUE SHIELD | Attending: Cardiovascular Disease | Admitting: Respiratory Therapy

## 2016-05-21 DIAGNOSIS — I214 Non-ST elevation (NSTEMI) myocardial infarction: Secondary | ICD-10-CM | POA: Insufficient documentation

## 2016-05-21 DIAGNOSIS — Z955 Presence of coronary angioplasty implant and graft: Secondary | ICD-10-CM

## 2016-05-21 DIAGNOSIS — I2129 ST elevation (STEMI) myocardial infarction involving other sites: Secondary | ICD-10-CM

## 2016-05-21 LAB — GLUCOSE, CAPILLARY
GLUCOSE-CAPILLARY: 127 mg/dL — AB (ref 65–99)
Glucose-Capillary: 121 mg/dL — ABNORMAL HIGH (ref 65–99)

## 2016-05-21 NOTE — Progress Notes (Signed)
Daily Session Note  Patient Details  Name: Holly Cross MRN: [id number] Date of Birth: 01-14-1961 Referring Provider:     Cardiac Rehab from 05/06/2016 in [facility] Cardiac and Pulmonary Rehab  Referring Provider  Frankey Poot MD      Encounter Date: 05/21/2016  Check In:     Session Check In - 05/21/16 0836      Check-In   Location [facility]-Cardiac & Pulmonary Rehab   Staff Present Alberteen Sam, MA, ACSM RCEP, Exercise Physiologist;Laureen Owens Shark, BS, RRT, Respiratory Therapist;Carroll Enterkin, RN, BSN   Supervising physician immediately available to respond to emergencies See telemetry face sheet for immediately available ER MD   Medication changes reported     No   Fall or balance concerns reported    No   Warm-up and Cool-down Performed on first and last piece of equipment   Resistance Training Performed Yes   VAD Patient? No     Pain Assessment   Currently in Pain? No/denies   Multiple Pain Sites No         History  Smoking Status  . Current Every Day Smoker  . Packs/day: 0.50  . Years: 30.00  . Types: Cigarettes  Smokeless Tobacco  . Former Environmental health practitioner Met:  Exercise tolerated well Personal goals reviewed No report of cardiac concerns or symptoms Strength training completed today  Goals Unmet:  Not Applicable  Comments: First full day of exercise!  Patient was oriented to gym and equipment including functions, settings, policies, and procedures.  Patient's individual exercise prescription and treatment plan were reviewed.  All starting workloads were established based on the results of the 6 minute walk test done at initial orientation visit.  The plan for exercise progression was also introduced and progression will be customized based on patient's performance and goals.    Dr. Emily Filbert is Medical Director for [place] and [facility].

## 2016-06-05 ENCOUNTER — Encounter: Payer: Self-pay | Admitting: *Deleted

## 2016-06-05 DIAGNOSIS — I2129 ST elevation (STEMI) myocardial infarction involving other sites: Secondary | ICD-10-CM

## 2016-06-05 DIAGNOSIS — Z955 Presence of coronary angioplasty implant and graft: Secondary | ICD-10-CM

## 2016-06-05 NOTE — Progress Notes (Signed)
Cardiac Individual Treatment Plan  Patient Details  Name: Holly Cross MRN: [id number] Date of Birth: 17-Sep-1960 Referring Provider:     Cardiac Rehab from 05/06/2016 in [facility] Cardiac and Pulmonary Rehab  Referring Provider  Hinderliter, Antony Haste MD      Initial Encounter Date:    Cardiac Rehab from 05/06/2016 in [facility] Cardiac and Pulmonary Rehab  Referring Provider  Hinderliter, Antony Haste MD      Visit Diagnosis: ST elevation myocardial infarction (STEMI) involving other coronary artery [facility])  Status post coronary artery stent placement  Patient's Home Medications on Admission:  Current Outpatient Prescriptions:  .  hydrochlorothiazide (HYDRODIURIL) 25 MG tablet, Take 25 mg by mouth daily as needed., Disp: , Rfl:  .  lisinopril (PRINIVIL,ZESTRIL) 5 MG tablet, Take 5 mg by mouth daily., Disp: , Rfl:  .  meloxicam (MOBIC) 15 MG tablet, Take 1 tablet (15 mg total) by mouth daily., Disp: 30 tablet, Rfl: 3 .  METFORMIN HCL PO, Take 500 mg by mouth 2 (two) times daily. , Disp: , Rfl:  .  METOPROLOL SUCCINATE ER PO, Take 200 mg by mouth daily., Disp: , Rfl:   Past Medical History: Past Medical History:  Diagnosis Date  . Diabetes mellitus without complication ([place])   . Hypertension     Tobacco Use: History  Smoking Status  . Current Every Day Smoker  . Packs/day: 0.50  . Years: 30.00  . Types: Cigarettes  Smokeless Tobacco  . 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       Exercise Target Goals:    Exercise Program Goal: Individual exercise prescription set with THRR, safety & activity barriers. Participant demonstrates ability to understand and report RPE using BORG scale, to self-measure pulse accurately, and to acknowledge the importance of the exercise prescription.  Exercise Prescription Goal: Starting with aerobic activity 30 plus minutes a day, 3 days per week for initial exercise prescription. Provide home  exercise prescription and guidelines that participant acknowledges understanding prior to discharge.  Activity Barriers & Risk Stratification:     Activity Barriers & Cardiac Risk Stratification - 05/06/16 1323      Activity Barriers & Cardiac Risk Stratification   Activity Barriers Deconditioning;History of Falls  passed out at least twice within last year, occasional dizziness   Cardiac Risk Stratification High      6 Minute Walk:     6 Minute Walk    Row Name 05/06/16 1447         6 Minute Walk   Phase Initial     Distance 1414 feet     Walk Time 6 minutes     # of Rest Breaks 0     MPH 2.68     METS 4.07     RPE 7     VO2 Peak 14.23     Symptoms No     Resting HR 75 bpm     Resting BP 140/80     Max Ex. HR 102 bpm     Max Ex. BP 138/76     2 Minute Post BP 126/74        Oxygen Initial Assessment:   Oxygen Re-Evaluation:   Oxygen Discharge (Final Oxygen Re-Evaluation):   Initial Exercise Prescription:     Initial Exercise Prescription - 05/06/16 1400      Date of Initial Exercise RX and Referring Provider   Referring Provider Hinderliter, Antony Haste MD  Treadmill   MPH 2.6   Grade 0.5   Minutes 15   METs 3.17     Elliptical   Level 1   Speed 4   Minutes 15     T5 Nustep   Level 3   SPM 80   Minutes 15   METs 2     Prescription Details   Frequency (times per week) 2   Duration Progress to 45 minutes of aerobic exercise without signs/symptoms of physical distress     Intensity   THRR 40-80% of Max Heartrate 112-149   Ratings of Perceived Exertion 11-13   Perceived Dyspnea 0-4     Progression   Progression Continue to progress workloads to maintain intensity without signs/symptoms of physical distress.     Resistance Training   Training Prescription Yes   Weight 3 lbs   Reps 10-15      Perform Capillary Blood Glucose checks as needed.  Exercise Prescription Changes:     Exercise Prescription Changes    Row Name 05/06/16  1400 05/29/16 1500           Response to Exercise   Blood Pressure (Admit) 140/80 130/78      Blood Pressure (Exercise) 138/76 180/84      Blood Pressure (Exit) 126/74 128/78      Heart Rate (Admit) 75 bpm 60 bpm      Heart Rate (Exercise) 102 bpm 122 bpm      Heart Rate (Exit) 80 bpm 85 bpm      Oxygen Saturation (Admit) 99 %  -      Oxygen Saturation (Exercise) 100 %  -      Rating of Perceived Exertion (Exercise) 7 13      Symptoms none none      Comments walk test results first full day of exercise      Duration  - Progress to 45 minutes of aerobic exercise without signs/symptoms of physical distress      Intensity  - THRR unchanged        Progression   Progression  - Continue to progress workloads to maintain intensity without signs/symptoms of physical distress.      Average METs  - 3.1        Resistance Training   Training Prescription  - Yes      Weight  - 3 lbs      Reps  - 10-15        Interval Training   Interval Training  - No        Treadmill   MPH  - 2.6      Grade  - 0.5      Minutes  - 15      METs  - 3.17        T5 Nustep   Level  - 3      Minutes  - 15      METs  - 3.1         Exercise Comments:     Exercise Comments    Row Name 05/21/16 1027           Exercise Comments First full day of exercise!  Patient was oriented to gym and equipment including functions, settings, policies, and procedures.  Patient's individual exercise prescription and treatment plan were reviewed.  All starting workloads were established based on the results of the 6 minute walk test done at initial orientation visit.  The plan for exercise progression was also introduced  and progression will be customized based on patient's performance and goals.          Exercise Goals and Review:     Exercise Goals    Row Name 05/06/16 1450             Exercise Goals   Increase Physical Activity Yes       Intervention Provide advice, education, support and counseling  about physical activity/exercise needs.;Develop an individualized exercise prescription for aerobic and resistive training based on initial evaluation findings, risk stratification, comorbidities and participant's personal goals.       Expected Outcomes Achievement of increased cardiorespiratory fitness and enhanced flexibility, muscular endurance and strength shown through measurements of functional capacity and personal statement of participant.       Increase Strength and Stamina Yes       Intervention Provide advice, education, support and counseling about physical activity/exercise needs.;Develop an individualized exercise prescription for aerobic and resistive training based on initial evaluation findings, risk stratification, comorbidities and participant's personal goals.       Expected Outcomes Achievement of increased cardiorespiratory fitness and enhanced flexibility, muscular endurance and strength shown through measurements of functional capacity and personal statement of participant.          Exercise Goals Re-Evaluation :     Exercise Goals Re-Evaluation    Row Name 05/29/16 1551             Exercise Goal Re-Evaluation   Exercise Goals Review Increase Physical Activity;Increase Strenth and Stamina       Comments Randall Hiss is off to a good start with rehab.  She has completed her first full day of exercise.  She has been out since then with her husband being sick.  She will do great in here.       Expected Outcomes Short and Long: Come to class to work on strength and stamina.          Discharge Exercise Prescription (Final Exercise Prescription Changes):     Exercise Prescription Changes - 05/29/16 1500      Response to Exercise   Blood Pressure (Admit) 130/78   Blood Pressure (Exercise) 180/84   Blood Pressure (Exit) 128/78   Heart Rate (Admit) 60 bpm   Heart Rate (Exercise) 122 bpm   Heart Rate (Exit) 85 bpm   Rating of Perceived Exertion (Exercise) 13   Symptoms  none   Comments first full day of exercise   Duration Progress to 45 minutes of aerobic exercise without signs/symptoms of physical distress   Intensity THRR unchanged     Progression   Progression Continue to progress workloads to maintain intensity without signs/symptoms of physical distress.   Average METs 3.1     Resistance Training   Training Prescription Yes   Weight 3 lbs   Reps 10-15     Interval Training   Interval Training No     Treadmill   MPH 2.6   Grade 0.5   Minutes 15   METs 3.17     T5 Nustep   Level 3   Minutes 15   METs 3.1      Nutrition:  Target Goals: Understanding of nutrition guidelines, daily intake of sodium 1500mg , cholesterol 200mg , calories 30% from fat and 7% or less from saturated fats, daily to have 5 or more servings of fruits and vegetables.  Biometrics:     Pre Biometrics - 05/06/16 1450      Pre Biometrics   Height 5' 3.4" (1.61  m)   Weight 153 lb 3.2 oz (69.5 kg)   Waist Circumference 31 inches   Hip Circumference 38 inches   Waist to Hip Ratio 0.82 %   BMI (Calculated) 26.9   Single Leg Stand 30 seconds       Nutrition Therapy Plan and Nutrition Goals:     Nutrition Therapy & Goals - 05/06/16 1323      Nutrition Therapy   RD appointment defered Yes  Diabetes "I know I need to eat several small meals a day"   Drug/Food Interactions Statins/Certain Fruits      Nutrition Discharge: Rate Your Plate Scores:     Nutrition Assessments - 05/06/16 1325      MEDFICTS Scores   Pre Score 27      Nutrition Goals Re-Evaluation:   Nutrition Goals Discharge (Final Nutrition Goals Re-Evaluation):   Psychosocial: Target Goals: Acknowledge presence or absence of significant depression and/or stress, maximize coping skills, provide positive support system. Participant is able to verbalize types and ability to use techniques and skills needed for reducing stress and depression.   Initial Review & Psychosocial  Screening:     Initial Psych Review & Screening - 05/06/16 1328      Initial Review   Current issues with Current Stress Concerns   Source of Stress Concerns Family;Chronic Illness   Comments Thetis mentioned that her husband needs a kidney and she was willing to donate one until the doctor told her that she could not since she was diagnosed with diabetes after a 30lbs weight loss in one month Nov 2017. She said her husband is on diability and they have a lot of boxes in their home since he is on home dialysis.       Quality of Life Scores:      Quality of Life - 05/06/16 1350      Quality of Life Scores   Health/Function Pre 21.6 %   Socioeconomic Pre 25.71 %   Psych/Spiritual Pre 26.57 %   Family Pre 18 %   GLOBAL Pre 22.94 %      PHQ-9: Recent Review Flowsheet Data    Depression screen [facility] 2/9 05/06/2016 12/21/2014   Decreased Interest 0 0   Down, Depressed, Hopeless 0 0   PHQ - 2 Score 0 0   Altered sleeping 0 -   Tired, decreased energy 1 -   Change in appetite 1 -   Feeling bad or failure about yourself  0 -   Trouble concentrating 0 -   Moving slowly or fidgety/restless 0 -   Suicidal thoughts 0 -   PHQ-9 Score 2 -   Difficult doing work/chores Somewhat difficult -     Interpretation of Total Score  Total Score Depression Severity:  1-4 = Minimal depression, 5-9 = Mild depression, 10-14 = Moderate depression, 15-19 = Moderately severe depression, 20-27 = Severe depression   Psychosocial Evaluation and Intervention:   Psychosocial Re-Evaluation:   Psychosocial Discharge (Final Psychosocial Re-Evaluation):   Vocational Rehabilitation: Provide vocational rehab assistance to qualifying candidates.   Vocational Rehab Evaluation & Intervention:     Vocational Rehab - 05/06/16 1319      Initial Vocational Rehab Evaluation & Intervention   Assessment shows need for Vocational Rehabilitation No      Education: Education Goals: Education classes will be  provided on a weekly basis, covering required topics. Participant will state understanding/return demonstration of topics presented.  Learning Barriers/Preferences:     Learning Barriers/Preferences - 05/06/16 1317  Learning Barriers/Preferences   Learning Barriers None   Learning Preferences Skilled Demonstration;Verbal Instruction      Education Topics: General Nutrition Guidelines/Fats and Fiber: -Group instruction provided by verbal, written material, models and posters to present the general guidelines for heart healthy nutrition. Gives an explanation and review of dietary fats and fiber.   Controlling Sodium/Reading Food Labels: -Group verbal and written material supporting the discussion of sodium use in heart healthy nutrition. Review and explanation with models, verbal and written materials for utilization of the food label.   Cardiac Rehab from 05/21/2016 in [facility] Cardiac and Pulmonary Rehab  Date  05/21/16  Educator  PI  Instruction Review Code  2- meets goals/outcomes      Exercise Physiology & Risk Factors: - Group verbal and written instruction with models to review the exercise physiology of the cardiovascular system and associated critical values. Details cardiovascular disease risk factors and the goals associated with each risk factor.   Aerobic Exercise & Resistance Training: - Gives group verbal and written discussion on the health impact of inactivity. On the components of aerobic and resistive training programs and the benefits of this training and how to safely progress through these programs.   Flexibility, Balance, General Exercise Guidelines: - Provides group verbal and written instruction on the benefits of flexibility and balance training programs. Provides general exercise guidelines with specific guidelines to those with heart or lung disease. Demonstration and skill practice provided.   Stress Management: - Provides group verbal and written  instruction about the health risks of elevated stress, cause of high stress, and healthy ways to reduce stress.   Depression: - Provides group verbal and written instruction on the correlation between heart/lung disease and depressed mood, treatment options, and the stigmas associated with seeking treatment.   Anatomy & Physiology of the Heart: - Group verbal and written instruction and models provide basic cardiac anatomy and physiology, with the coronary electrical and arterial systems. Review of: AMI, Angina, Valve disease, Heart Failure, Cardiac Arrhythmia, Pacemakers, and the ICD.   Cardiac Procedures: - Group verbal and written instruction and models to describe the testing methods done to diagnose heart disease. Reviews the outcomes of the test results. Describes the treatment choices: Medical Management, Angioplasty, or Coronary Bypass Surgery.   Cardiac Medications: - Group verbal and written instruction to review commonly prescribed medications for heart disease. Reviews the medication, class of the drug, and side effects. Includes the steps to properly store meds and maintain the prescription regimen.   Go Sex-Intimacy & Heart Disease, Get SMART - Goal Setting: - Group verbal and written instruction through game format to discuss heart disease and the return to sexual intimacy. Provides group verbal and written material to discuss and apply goal setting through the application of the S.M.A.R.T. Method.   Other Matters of the Heart: - Provides group verbal, written materials and models to describe Heart Failure, Angina, Valve Disease, and Diabetes in the realm of heart disease. Includes description of the disease process and treatment options available to the cardiac patient.   Exercise & Equipment Safety: - Individual verbal instruction and demonstration of equipment use and safety with use of the equipment.   Cardiac Rehab from 05/21/2016 in [facility] Cardiac and Pulmonary Rehab   Date  05/06/16  Educator  C. [place]  Instruction Review Code  1- partially meets, needs review/practice      Infection Prevention: - Provides verbal and written material to individual with discussion of infection control including proper hand washing  and proper equipment cleaning during exercise session.   Cardiac Rehab from 05/21/2016 in [facility] Cardiac and Pulmonary Rehab  Date  05/06/16  Educator  C. [place], RN  Instruction Review Code  2- meets goals/outcomes      Falls Prevention: - Provides verbal and written material to individual with discussion of falls prevention and safety.   Cardiac Rehab from 05/21/2016 in [facility] Cardiac and Pulmonary Rehab  Date  05/06/16  Educator  C. [place]  Instruction Review Code  2- meets goals/outcomes      Diabetes: - Individual verbal and written instruction to review signs/symptoms of diabetes, desired ranges of glucose level fasting, after meals and with exercise. Advice that pre and post exercise glucose checks will be done for 3 sessions at entry of program.   Cardiac Rehab from 05/21/2016 in [facility] Cardiac and Pulmonary Rehab  Date  05/06/16  Educator  Cindy Hazy, RN  Instruction Review Code  1- partially meets, needs review/practice       Knowledge Questionnaire Score:     Knowledge Questionnaire Score - 05/06/16 1318      Knowledge Questionnaire Score   Pre Score 17/28      Core Components/Risk Factors/Patient Goals at Admission:     Personal Goals and Risk Factors at Admission - 05/06/16 1326      Core Components/Risk Factors/Patient Goals on Admission    Weight Management Yes;Weight Loss   Intervention Weight Management: Develop a combined nutrition and exercise program designed to reach desired caloric intake, while maintaining appropriate intake of nutrient and fiber, sodium and fats, and appropriate energy expenditure required for the weight goal.;Weight Management: Provide education and appropriate resources to  help participant work on and attain dietary goals.   Admit Weight 153 lb 3.2 oz (69.5 kg)   Goal Weight: Short Term 150 lb (68 kg)   Goal Weight: Long Term 145 lb (65.8 kg)   Expected Outcomes Short Term: Continue to assess and modify interventions until short term weight is achieved;Long Term: Adherence to nutrition and physical activity/exercise program aimed toward attainment of established weight goal;Weight Loss: Understanding of general recommendations for a balanced deficit meal plan, which promotes 1-2 lb weight loss per week and includes a negative energy balance of [phone] kcal/d;Understanding recommendations for meals to include 15-35% energy as protein, 25-35% energy from fat, 35-60% energy from carbohydrates, less than 200mg  of dietary cholesterol, 20-35 gm of total fiber daily;Understanding of distribution of calorie intake throughout the day with the consumption of 4-5 meals/snacks   Tobacco Cessation Yes   Number of packs per day Quit March 2018 Smoked 1/2 pack for 20 years   Expected Outcomes Long Term: Complete abstinence from all tobacco products for at least 12 months from quit date.;Short Term: Will quit all tobacco product use, adhering to prevention of relapse plan.   Diabetes Yes  Newly diagnosed in November 2017   Intervention Provide education about signs/symptoms and action to take for hypo/hyperglycemia.;Provide education about proper nutrition, including hydration, and aerobic/resistive exercise prescription along with prescribed medications to achieve blood glucose in normal ranges: Fasting glucose 65-99 mg/dL   Expected Outcomes Short Term: Participant verbalizes understanding of the signs/symptoms and immediate care of hyper/hypoglycemia, proper foot care and importance of medication, aerobic/resistive exercise and nutrition plan for blood glucose control.;Long Term: Attainment of HbA1C < 7%.   Hypertension Yes   Intervention Provide education on lifestyle modifcations  including regular physical activity/exercise, weight management, moderate sodium restriction and increased consumption of fresh fruit, vegetables, and  low fat dairy, alcohol moderation, and smoking cessation.;Monitor prescription use compliance.   Expected Outcomes Short Term: Continued assessment and intervention until BP is < 140/47mm HG in hypertensive participants. < 130/60mm HG in hypertensive participants with diabetes, heart failure or chronic kidney disease.;Long Term: Maintenance of blood pressure at goal levels.   Lipids Yes   Intervention Provide education and support for participant on nutrition & aerobic/resistive exercise along with prescribed medications to achieve LDL 70mg , HDL >40mg .   Expected Outcomes Short Term: Participant states understanding of desired cholesterol values and is compliant with medications prescribed. Participant is following exercise prescription and nutrition guidelines.;Long Term: Cholesterol controlled with medications as prescribed, with individualized exercise RX and with personalized nutrition plan. Value goals: LDL < 70mg , HDL > 40 mg.   Stress Yes  Her husband is on home dialysis, needs a kidney, is on disability. She works nights   Intervention Offer individual and/or small group education and counseling on adjustment to heart disease, stress management and health-related lifestyle change. Teach and support self-help strategies.;Refer participants experiencing significant psychosocial distress to appropriate mental health specialists for further evaluation and treatment. When possible, include family members and significant others in education/counseling sessions.   Expected Outcomes Short Term: Participant demonstrates changes in health-related behavior, relaxation and other stress management skills, ability to obtain effective social support, and compliance with psychotropic medications if prescribed.;Long Term: Emotional wellbeing is indicated by absence of  clinically significant psychosocial distress or social isolation.      Core Components/Risk Factors/Patient Goals Review:    Core Components/Risk Factors/Patient Goals at Discharge (Final Review):    ITP Comments:     ITP Comments    Row Name 05/06/16 1321 05/06/16 1324 05/06/16 1342 05/08/16 0618 06/05/16 0908   ITP Comments ITP Created During Medical Review. Documentation of Diagnosis 03/30/2016 [facility] Encounter Mayla said she knows she needs to eat several small meals a day but she said she works nights. She does not like nab crackers. Ginamarie said she gets up at 5pm to go to work and she does not like to eat in her car.  ITP was created during Medical review. Documentation of dix [facility] 03/30/2016 note. 30 day review. Continue with ITP unless directed changes per Medical Director review  New to program  has attended medical review 30 day review. Continue with ITP unless directed changes per Medical Director review  first visit 5/8      Comments:

## 2016-06-05 NOTE — Progress Notes (Signed)
Cardiac Individual Treatment Plan  Patient Details  Name: Holly Cross MRN: [id number] Date of Birth: 13-Feb-1960 Referring Provider:     Cardiac Rehab from 05/06/2016 in [facility] Cardiac and Pulmonary Rehab  Referring Provider  Hinderliter, Antony Haste MD      Initial Encounter Date:    Cardiac Rehab from 05/06/2016 in [facility] Cardiac and Pulmonary Rehab  Referring Provider  Hinderliter, Antony Haste MD      Visit Diagnosis: ST elevation myocardial infarction (STEMI) involving other coronary artery [facility])  Status post coronary artery stent placement  Patient's Home Medications on Admission:  Current Outpatient Prescriptions:  .  hydrochlorothiazide (HYDRODIURIL) 25 MG tablet, Take 25 mg by mouth daily as needed., Disp: , Rfl:  .  lisinopril (PRINIVIL,ZESTRIL) 5 MG tablet, Take 5 mg by mouth daily., Disp: , Rfl:  .  meloxicam (MOBIC) 15 MG tablet, Take 1 tablet (15 mg total) by mouth daily., Disp: 30 tablet, Rfl: 3 .  METFORMIN HCL PO, Take 500 mg by mouth 2 (two) times daily. , Disp: , Rfl:  .  METOPROLOL SUCCINATE ER PO, Take 200 mg by mouth daily., Disp: , Rfl:   Past Medical History: Past Medical History:  Diagnosis Date  . Diabetes mellitus without complication ([place])   . Hypertension     Tobacco Use: History  Smoking Status  . Current Every Day Smoker  . Packs/day: 0.50  . Years: 30.00  . Types: Cigarettes  Smokeless Tobacco  . 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       Exercise Target Goals:    Exercise Program Goal: Individual exercise prescription set with THRR, safety & activity barriers. Participant demonstrates ability to understand and report RPE using BORG scale, to self-measure pulse accurately, and to acknowledge the importance of the exercise prescription.  Exercise Prescription Goal: Starting with aerobic activity 30 plus minutes a day, 3 days per week for initial exercise prescription. Provide home  exercise prescription and guidelines that participant acknowledges understanding prior to discharge.  Activity Barriers & Risk Stratification:     Activity Barriers & Cardiac Risk Stratification - 05/06/16 1323      Activity Barriers & Cardiac Risk Stratification   Activity Barriers Deconditioning;History of Falls  passed out at least twice within last year, occasional dizziness   Cardiac Risk Stratification High      6 Minute Walk:     6 Minute Walk    Row Name 05/06/16 1447         6 Minute Walk   Phase Initial     Distance 1414 feet     Walk Time 6 minutes     # of Rest Breaks 0     MPH 2.68     METS 4.07     RPE 7     VO2 Peak 14.23     Symptoms No     Resting HR 75 bpm     Resting BP 140/80     Max Ex. HR 102 bpm     Max Ex. BP 138/76     2 Minute Post BP 126/74        Oxygen Initial Assessment:   Oxygen Re-Evaluation:   Oxygen Discharge (Final Oxygen Re-Evaluation):   Initial Exercise Prescription:     Initial Exercise Prescription - 05/06/16 1400      Date of Initial Exercise RX and Referring Provider   Referring Provider Hinderliter, Antony Haste MD  Treadmill   MPH 2.6   Grade 0.5   Minutes 15   METs 3.17     Elliptical   Level 1   Speed 4   Minutes 15     T5 Nustep   Level 3   SPM 80   Minutes 15   METs 2     Prescription Details   Frequency (times per week) 2   Duration Progress to 45 minutes of aerobic exercise without signs/symptoms of physical distress     Intensity   THRR 40-80% of Max Heartrate 112-149   Ratings of Perceived Exertion 11-13   Perceived Dyspnea 0-4     Progression   Progression Continue to progress workloads to maintain intensity without signs/symptoms of physical distress.     Resistance Training   Training Prescription Yes   Weight 3 lbs   Reps 10-15      Perform Capillary Blood Glucose checks as needed.  Exercise Prescription Changes:     Exercise Prescription Changes    Row Name 05/06/16  1400 05/29/16 1500           Response to Exercise   Blood Pressure (Admit) 140/80 130/78      Blood Pressure (Exercise) 138/76 180/84      Blood Pressure (Exit) 126/74 128/78      Heart Rate (Admit) 75 bpm 60 bpm      Heart Rate (Exercise) 102 bpm 122 bpm      Heart Rate (Exit) 80 bpm 85 bpm      Oxygen Saturation (Admit) 99 %  -      Oxygen Saturation (Exercise) 100 %  -      Rating of Perceived Exertion (Exercise) 7 13      Symptoms none none      Comments walk test results first full day of exercise      Duration  - Progress to 45 minutes of aerobic exercise without signs/symptoms of physical distress      Intensity  - THRR unchanged        Progression   Progression  - Continue to progress workloads to maintain intensity without signs/symptoms of physical distress.      Average METs  - 3.1        Resistance Training   Training Prescription  - Yes      Weight  - 3 lbs      Reps  - 10-15        Interval Training   Interval Training  - No        Treadmill   MPH  - 2.6      Grade  - 0.5      Minutes  - 15      METs  - 3.17        T5 Nustep   Level  - 3      Minutes  - 15      METs  - 3.1         Exercise Comments:     Exercise Comments    Row Name 05/21/16 1027           Exercise Comments First full day of exercise!  Patient was oriented to gym and equipment including functions, settings, policies, and procedures.  Patient's individual exercise prescription and treatment plan were reviewed.  All starting workloads were established based on the results of the 6 minute walk test done at initial orientation visit.  The plan for exercise progression was also introduced  and progression will be customized based on patient's performance and goals.          Exercise Goals and Review:     Exercise Goals    Row Name 05/06/16 1450             Exercise Goals   Increase Physical Activity Yes       Intervention Provide advice, education, support and counseling  about physical activity/exercise needs.;Develop an individualized exercise prescription for aerobic and resistive training based on initial evaluation findings, risk stratification, comorbidities and participant's personal goals.       Expected Outcomes Achievement of increased cardiorespiratory fitness and enhanced flexibility, muscular endurance and strength shown through measurements of functional capacity and personal statement of participant.       Increase Strength and Stamina Yes       Intervention Provide advice, education, support and counseling about physical activity/exercise needs.;Develop an individualized exercise prescription for aerobic and resistive training based on initial evaluation findings, risk stratification, comorbidities and participant's personal goals.       Expected Outcomes Achievement of increased cardiorespiratory fitness and enhanced flexibility, muscular endurance and strength shown through measurements of functional capacity and personal statement of participant.          Exercise Goals Re-Evaluation :     Exercise Goals Re-Evaluation    Row Name 05/29/16 1551             Exercise Goal Re-Evaluation   Exercise Goals Review Increase Physical Activity;Increase Strenth and Stamina       Comments Randall Hiss is off to a good start with rehab.  She has completed her first full day of exercise.  She has been out since then with her husband being sick.  She will do great in here.       Expected Outcomes Short and Long: Come to class to work on strength and stamina.          Discharge Exercise Prescription (Final Exercise Prescription Changes):     Exercise Prescription Changes - 05/29/16 1500      Response to Exercise   Blood Pressure (Admit) 130/78   Blood Pressure (Exercise) 180/84   Blood Pressure (Exit) 128/78   Heart Rate (Admit) 60 bpm   Heart Rate (Exercise) 122 bpm   Heart Rate (Exit) 85 bpm   Rating of Perceived Exertion (Exercise) 13   Symptoms  none   Comments first full day of exercise   Duration Progress to 45 minutes of aerobic exercise without signs/symptoms of physical distress   Intensity THRR unchanged     Progression   Progression Continue to progress workloads to maintain intensity without signs/symptoms of physical distress.   Average METs 3.1     Resistance Training   Training Prescription Yes   Weight 3 lbs   Reps 10-15     Interval Training   Interval Training No     Treadmill   MPH 2.6   Grade 0.5   Minutes 15   METs 3.17     T5 Nustep   Level 3   Minutes 15   METs 3.1      Nutrition:  Target Goals: Understanding of nutrition guidelines, daily intake of sodium 1500mg , cholesterol 200mg , calories 30% from fat and 7% or less from saturated fats, daily to have 5 or more servings of fruits and vegetables.  Biometrics:     Pre Biometrics - 05/06/16 1450      Pre Biometrics   Height 5' 3.4" (1.61  m)   Weight 153 lb 3.2 oz (69.5 kg)   Waist Circumference 31 inches   Hip Circumference 38 inches   Waist to Hip Ratio 0.82 %   BMI (Calculated) 26.9   Single Leg Stand 30 seconds       Nutrition Therapy Plan and Nutrition Goals:     Nutrition Therapy & Goals - 05/06/16 1323      Nutrition Therapy   RD appointment defered Yes  Diabetes "I know I need to eat several small meals a day"   Drug/Food Interactions Statins/Certain Fruits      Nutrition Discharge: Rate Your Plate Scores:     Nutrition Assessments - 05/06/16 1325      MEDFICTS Scores   Pre Score 27      Nutrition Goals Re-Evaluation:   Nutrition Goals Discharge (Final Nutrition Goals Re-Evaluation):   Psychosocial: Target Goals: Acknowledge presence or absence of significant depression and/or stress, maximize coping skills, provide positive support system. Participant is able to verbalize types and ability to use techniques and skills needed for reducing stress and depression.   Initial Review & Psychosocial  Screening:     Initial Psych Review & Screening - 05/06/16 1328      Initial Review   Current issues with Current Stress Concerns   Source of Stress Concerns Family;Chronic Illness   Comments Geraldene mentioned that her husband needs a kidney and she was willing to donate one until the doctor told her that she could not since she was diagnosed with diabetes after a 30lbs weight loss in one month Nov 2017. She said her husband is on diability and they have a lot of boxes in their home since he is on home dialysis.       Quality of Life Scores:      Quality of Life - 05/06/16 1350      Quality of Life Scores   Health/Function Pre 21.6 %   Socioeconomic Pre 25.71 %   Psych/Spiritual Pre 26.57 %   Family Pre 18 %   GLOBAL Pre 22.94 %      PHQ-9: Recent Review Flowsheet Data    Depression screen [facility] 2/9 05/06/2016 12/21/2014   Decreased Interest 0 0   Down, Depressed, Hopeless 0 0   PHQ - 2 Score 0 0   Altered sleeping 0 -   Tired, decreased energy 1 -   Change in appetite 1 -   Feeling bad or failure about yourself  0 -   Trouble concentrating 0 -   Moving slowly or fidgety/restless 0 -   Suicidal thoughts 0 -   PHQ-9 Score 2 -   Difficult doing work/chores Somewhat difficult -     Interpretation of Total Score  Total Score Depression Severity:  1-4 = Minimal depression, 5-9 = Mild depression, 10-14 = Moderate depression, 15-19 = Moderately severe depression, 20-27 = Severe depression   Psychosocial Evaluation and Intervention:   Psychosocial Re-Evaluation:   Psychosocial Discharge (Final Psychosocial Re-Evaluation):   Vocational Rehabilitation: Provide vocational rehab assistance to qualifying candidates.   Vocational Rehab Evaluation & Intervention:     Vocational Rehab - 05/06/16 1319      Initial Vocational Rehab Evaluation & Intervention   Assessment shows need for Vocational Rehabilitation No      Education: Education Goals: Education classes will be  provided on a weekly basis, covering required topics. Participant will state understanding/return demonstration of topics presented.  Learning Barriers/Preferences:     Learning Barriers/Preferences - 05/06/16 1317  Learning Barriers/Preferences   Learning Barriers None   Learning Preferences Skilled Demonstration;Verbal Instruction      Education Topics: General Nutrition Guidelines/Fats and Fiber: -Group instruction provided by verbal, written material, models and posters to present the general guidelines for heart healthy nutrition. Gives an explanation and review of dietary fats and fiber.   Controlling Sodium/Reading Food Labels: -Group verbal and written material supporting the discussion of sodium use in heart healthy nutrition. Review and explanation with models, verbal and written materials for utilization of the food label.   Cardiac Rehab from 05/21/2016 in [facility] Cardiac and Pulmonary Rehab  Date  05/21/16  Educator  PI  Instruction Review Code  2- meets goals/outcomes      Exercise Physiology & Risk Factors: - Group verbal and written instruction with models to review the exercise physiology of the cardiovascular system and associated critical values. Details cardiovascular disease risk factors and the goals associated with each risk factor.   Aerobic Exercise & Resistance Training: - Gives group verbal and written discussion on the health impact of inactivity. On the components of aerobic and resistive training programs and the benefits of this training and how to safely progress through these programs.   Flexibility, Balance, General Exercise Guidelines: - Provides group verbal and written instruction on the benefits of flexibility and balance training programs. Provides general exercise guidelines with specific guidelines to those with heart or lung disease. Demonstration and skill practice provided.   Stress Management: - Provides group verbal and written  instruction about the health risks of elevated stress, cause of high stress, and healthy ways to reduce stress.   Depression: - Provides group verbal and written instruction on the correlation between heart/lung disease and depressed mood, treatment options, and the stigmas associated with seeking treatment.   Anatomy & Physiology of the Heart: - Group verbal and written instruction and models provide basic cardiac anatomy and physiology, with the coronary electrical and arterial systems. Review of: AMI, Angina, Valve disease, Heart Failure, Cardiac Arrhythmia, Pacemakers, and the ICD.   Cardiac Procedures: - Group verbal and written instruction and models to describe the testing methods done to diagnose heart disease. Reviews the outcomes of the test results. Describes the treatment choices: Medical Management, Angioplasty, or Coronary Bypass Surgery.   Cardiac Medications: - Group verbal and written instruction to review commonly prescribed medications for heart disease. Reviews the medication, class of the drug, and side effects. Includes the steps to properly store meds and maintain the prescription regimen.   Go Sex-Intimacy & Heart Disease, Get SMART - Goal Setting: - Group verbal and written instruction through game format to discuss heart disease and the return to sexual intimacy. Provides group verbal and written material to discuss and apply goal setting through the application of the S.M.A.R.T. Method.   Other Matters of the Heart: - Provides group verbal, written materials and models to describe Heart Failure, Angina, Valve Disease, and Diabetes in the realm of heart disease. Includes description of the disease process and treatment options available to the cardiac patient.   Exercise & Equipment Safety: - Individual verbal instruction and demonstration of equipment use and safety with use of the equipment.   Cardiac Rehab from 05/21/2016 in [facility] Cardiac and Pulmonary Rehab   Date  05/06/16  Educator  C. [place]  Instruction Review Code  1- partially meets, needs review/practice      Infection Prevention: - Provides verbal and written material to individual with discussion of infection control including proper hand washing  and proper equipment cleaning during exercise session.   Cardiac Rehab from 05/21/2016 in [facility] Cardiac and Pulmonary Rehab  Date  05/06/16  Educator  C. Enterkin, RN  Instruction Review Code  2- meets goals/outcomes      Falls Prevention: - Provides verbal and written material to individual with discussion of falls prevention and safety.   Cardiac Rehab from 05/21/2016 in [facility] Cardiac and Pulmonary Rehab  Date  05/06/16  Educator  C. [place]  Instruction Review Code  2- meets goals/outcomes      Diabetes: - Individual verbal and written instruction to review signs/symptoms of diabetes, desired ranges of glucose level fasting, after meals and with exercise. Advice that pre and post exercise glucose checks will be done for 3 sessions at entry of program.   Cardiac Rehab from 05/21/2016 in [facility] Cardiac and Pulmonary Rehab  Date  05/06/16  Educator  Cindy Hazy, RN  Instruction Review Code  1- partially meets, needs review/practice       Knowledge Questionnaire Score:     Knowledge Questionnaire Score - 05/06/16 1318      Knowledge Questionnaire Score   Pre Score 17/28      Core Components/Risk Factors/Patient Goals at Admission:     Personal Goals and Risk Factors at Admission - 05/06/16 1326      Core Components/Risk Factors/Patient Goals on Admission    Weight Management Yes;Weight Loss   Intervention Weight Management: Develop a combined nutrition and exercise program designed to reach desired caloric intake, while maintaining appropriate intake of nutrient and fiber, sodium and fats, and appropriate energy expenditure required for the weight goal.;Weight Management: Provide education and appropriate resources to  help participant work on and attain dietary goals.   Admit Weight 153 lb 3.2 oz (69.5 kg)   Goal Weight: Short Term 150 lb (68 kg)   Goal Weight: Long Term 145 lb (65.8 kg)   Expected Outcomes Short Term: Continue to assess and modify interventions until short term weight is achieved;Long Term: Adherence to nutrition and physical activity/exercise program aimed toward attainment of established weight goal;Weight Loss: Understanding of general recommendations for a balanced deficit meal plan, which promotes 1-2 lb weight loss per week and includes a negative energy balance of [phone] kcal/d;Understanding recommendations for meals to include 15-35% energy as protein, 25-35% energy from fat, 35-60% energy from carbohydrates, less than 200mg  of dietary cholesterol, 20-35 gm of total fiber daily;Understanding of distribution of calorie intake throughout the day with the consumption of 4-5 meals/snacks   Tobacco Cessation Yes   Number of packs per day Quit March 2018 Smoked 1/2 pack for 20 years   Expected Outcomes Long Term: Complete abstinence from all tobacco products for at least 12 months from quit date.;Short Term: Will quit all tobacco product use, adhering to prevention of relapse plan.   Diabetes Yes  Newly diagnosed in November 2017   Intervention Provide education about signs/symptoms and action to take for hypo/hyperglycemia.;Provide education about proper nutrition, including hydration, and aerobic/resistive exercise prescription along with prescribed medications to achieve blood glucose in normal ranges: Fasting glucose 65-99 mg/dL   Expected Outcomes Short Term: Participant verbalizes understanding of the signs/symptoms and immediate care of hyper/hypoglycemia, proper foot care and importance of medication, aerobic/resistive exercise and nutrition plan for blood glucose control.;Long Term: Attainment of HbA1C < 7%.   Hypertension Yes   Intervention Provide education on lifestyle modifcations  including regular physical activity/exercise, weight management, moderate sodium restriction and increased consumption of fresh fruit, vegetables, and  low fat dairy, alcohol moderation, and smoking cessation.;Monitor prescription use compliance.   Expected Outcomes Short Term: Continued assessment and intervention until BP is < 140/64mm HG in hypertensive participants. < 130/72mm HG in hypertensive participants with diabetes, heart failure or chronic kidney disease.;Long Term: Maintenance of blood pressure at goal levels.   Lipids Yes   Intervention Provide education and support for participant on nutrition & aerobic/resistive exercise along with prescribed medications to achieve LDL 70mg , HDL >40mg .   Expected Outcomes Short Term: Participant states understanding of desired cholesterol values and is compliant with medications prescribed. Participant is following exercise prescription and nutrition guidelines.;Long Term: Cholesterol controlled with medications as prescribed, with individualized exercise RX and with personalized nutrition plan. Value goals: LDL < 70mg , HDL > 40 mg.   Stress Yes  Her husband is on home dialysis, needs a kidney, is on disability. She works nights   Intervention Offer individual and/or small group education and counseling on adjustment to heart disease, stress management and health-related lifestyle change. Teach and support self-help strategies.;Refer participants experiencing significant psychosocial distress to appropriate mental health specialists for further evaluation and treatment. When possible, include family members and significant others in education/counseling sessions.   Expected Outcomes Short Term: Participant demonstrates changes in health-related behavior, relaxation and other stress management skills, ability to obtain effective social support, and compliance with psychotropic medications if prescribed.;Long Term: Emotional wellbeing is indicated by absence of  clinically significant psychosocial distress or social isolation.      Core Components/Risk Factors/Patient Goals Review:    Core Components/Risk Factors/Patient Goals at Discharge (Final Review):    ITP Comments:     ITP Comments    Row Name 05/06/16 1321 05/06/16 1324 05/06/16 1342 05/08/16 0618 06/05/16 0908   ITP Comments ITP Created During Medical Review. Documentation of Diagnosis 03/30/2016 [facility] Encounter Louretta said she knows she needs to eat several small meals a day but she said she works nights. She does not like nab crackers. Jynesis said she gets up at 5pm to go to work and she does not like to eat in her car.  ITP was created during Medical review. Documentation of dix [facility] 03/30/2016 note. 30 day review. Continue with ITP unless directed changes per Medical Director review  New to program  has attended medical review 30 day review. Continue with ITP unless directed changes per Medical Director review  first visit 5/8   Row Name 06/05/16 0912           ITP Comments 30 day review. Continue with ITP unless directed changes per Medical Director review          Comments:

## 2016-06-11 ENCOUNTER — Encounter: Payer: Self-pay | Admitting: *Deleted

## 2016-06-11 ENCOUNTER — Telehealth: Payer: Self-pay | Admitting: *Deleted

## 2016-06-11 DIAGNOSIS — Z955 Presence of coronary angioplasty implant and graft: Secondary | ICD-10-CM

## 2016-06-11 DIAGNOSIS — I2129 ST elevation (STEMI) myocardial infarction involving other sites: Secondary | ICD-10-CM
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
## 2016-06-18 ENCOUNTER — Encounter: Payer: BLUE CROSS/BLUE SHIELD | Attending: Cardiovascular Disease

## 2016-06-18 DIAGNOSIS — I214 Non-ST elevation (NSTEMI) myocardial infarction: Secondary | ICD-10-CM | POA: Insufficient documentation

## 2016-06-25 ENCOUNTER — Telehealth: Payer: Self-pay | Admitting: *Deleted

## 2016-06-25 ENCOUNTER — Encounter: Payer: Self-pay | Admitting: *Deleted

## 2016-06-25 DIAGNOSIS — Z955 Presence of coronary angioplasty implant and graft: Secondary | ICD-10-CM

## 2016-06-25 DIAGNOSIS — I2129 ST elevation (STEMI) myocardial infarction involving other sites: Secondary | ICD-10-CM

## 2016-06-25 NOTE — Telephone Encounter (Signed)
Called to check on status of return.  Unable to leave message.

## 2016-07-03 ENCOUNTER — Encounter: Payer: Self-pay | Admitting: *Deleted

## 2016-07-03 DIAGNOSIS — I2129 ST elevation (STEMI) myocardial infarction involving other sites: Secondary | ICD-10-CM

## 2016-07-03 DIAGNOSIS — Z955 Presence of coronary angioplasty implant and graft: Secondary | ICD-10-CM
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
## 2016-07-09 ENCOUNTER — Encounter: Payer: Self-pay | Admitting: *Deleted

## 2016-07-09 ENCOUNTER — Telehealth: Payer: Self-pay | Admitting: *Deleted

## 2016-07-09 DIAGNOSIS — I2129 ST elevation (STEMI) myocardial infarction involving other sites: Secondary | ICD-10-CM

## 2016-07-09 DIAGNOSIS — Z955 Presence of coronary angioplasty implant and graft: Secondary | ICD-10-CM
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
## 2016-07-16 ENCOUNTER — Encounter: Payer: BLUE CROSS/BLUE SHIELD | Attending: Cardiovascular Disease

## 2016-07-16 DIAGNOSIS — I214 Non-ST elevation (NSTEMI) myocardial infarction: Secondary | ICD-10-CM | POA: Insufficient documentation

## 2016-07-18 ENCOUNTER — Encounter: Payer: Self-pay | Admitting: *Deleted

## 2016-07-18 DIAGNOSIS — I2129 ST elevation (STEMI) myocardial infarction involving other sites: Secondary | ICD-10-CM

## 2016-07-18 DIAGNOSIS — Z955 Presence of coronary angioplasty implant and graft: Secondary | ICD-10-CM

## 2016-07-31 ENCOUNTER — Encounter: Payer: Self-pay | Admitting: *Deleted

## 2016-07-31 DIAGNOSIS — Z955 Presence of coronary angioplasty implant and graft: Secondary | ICD-10-CM

## 2016-07-31 DIAGNOSIS — I2129 ST elevation (STEMI) myocardial infarction involving other sites: Secondary | ICD-10-CM
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
## 2016-08-06 ENCOUNTER — Encounter: Payer: Self-pay | Admitting: *Deleted

## 2016-08-06 DIAGNOSIS — Z955 Presence of coronary angioplasty implant and graft: Secondary | ICD-10-CM

## 2016-08-06 DIAGNOSIS — I2129 ST elevation (STEMI) myocardial infarction involving other sites: Secondary | ICD-10-CM
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
## 2019-12-02 ENCOUNTER — Other Ambulatory Visit: Payer: Self-pay | Admitting: Family Medicine

## 2019-12-02 DIAGNOSIS — Z1231 Encounter for screening mammogram for malignant neoplasm of breast: Secondary | ICD-10-CM

## 2020-01-20 ENCOUNTER — Ambulatory Visit
Admission: RE | Admit: 2020-01-20 | Discharge: 2020-01-20 | Disposition: A | Discharge status: Home / Self Care | Payer: BC Managed Care – PPO | Source: Ambulatory Visit | Attending: Family Medicine | Admitting: Family Medicine

## 2020-01-20 ENCOUNTER — Other Ambulatory Visit: Payer: Self-pay

## 2020-01-20 DIAGNOSIS — Z1231 Encounter for screening mammogram for malignant neoplasm of breast: Secondary | ICD-10-CM | POA: Diagnosis not present

## 2021-09-20 ENCOUNTER — Telehealth: Payer: Self-pay

## 2021-09-20 ENCOUNTER — Other Ambulatory Visit: Payer: Self-pay

## 2021-09-20 DIAGNOSIS — Z1211 Encounter for screening for malignant neoplasm of colon: Secondary | ICD-10-CM

## 2021-09-20 MED ORDER — NA SULFATE-K SULFATE-MG SULF 17.5-3.13-1.6 GM/177ML PO SOLN: 1.0000 | Freq: Once | ORAL | 0 refills | Status: AC

## 2021-09-20 NOTE — Telephone Encounter (Signed)
Gastroenterology Pre-Procedure Review  Request Date: 11/09/21 Requesting Physician: Dr. Allen Norris  PATIENT REVIEW QUESTIONS: The patient responded to the following health history questions as indicated:    1. Are you having any GI issues? no 2. Do you have a personal history of Polyps? no 3. Do you have a family history of Colon Cancer or Polyps? no 4. Diabetes Mellitus? yes (patient takes metformin has been advised to hold metformin) 5. Joint replacements in the past 12 months?no 6. Major health problems in the past 3 months?no 7. Any artificial heart valves, MVP, or defibrillator?no    MEDICATIONS & ALLERGIES:    Patient reports the following regarding taking any anticoagulation/antiplatelet therapy:   Plavix, Coumadin, Eliquis, Xarelto, Lovenox, Pradaxa, Brilinta, or Effient? no Aspirin? no  Patient confirms/reports the following medications:  Current Outpatient Medications  Medication Sig Dispense Refill   hydrochlorothiazide (HYDRODIURIL) 25 MG tablet Take 25 mg by mouth daily as needed.     lisinopril (PRINIVIL,ZESTRIL) 5 MG tablet Take 5 mg by mouth daily.     meloxicam (MOBIC) 15 MG tablet Take 1 tablet (15 mg total) by mouth daily. 30 tablet 3   METFORMIN HCL PO Take 500 mg by mouth 2 (two) times daily.      METOPROLOL SUCCINATE ER PO Take 200 mg by mouth daily.     No current facility-administered medications for this visit.    Patient confirms/reports the following allergies:  No Known Allergies  No orders of the defined types were placed in this encounter.   AUTHORIZATION INFORMATION Primary Insurance: 1D#: Group #:  Secondary Insurance: 1D#: Group #:  SCHEDULE INFORMATION: Date: 11/09/21 Time: Location: [place]

## 2021-10-04 ENCOUNTER — Encounter: Payer: Self-pay | Admitting: Gastroenterology

## 2021-11-05 ENCOUNTER — Encounter: Payer: Self-pay | Admitting: Gastroenterology

## 2021-11-09 ENCOUNTER — Ambulatory Visit: Payer: BC Managed Care – PPO | Admitting: General Practice

## 2021-11-09 ENCOUNTER — Other Ambulatory Visit: Payer: Self-pay

## 2021-11-09 ENCOUNTER — Encounter: Payer: Self-pay | Admitting: Gastroenterology

## 2021-11-09 ENCOUNTER — Ambulatory Visit
Admission: RE | Admit: 2021-11-09 | Discharge: 2021-11-09 | Disposition: A | Discharge status: Home / Self Care | Payer: BC Managed Care – PPO | Attending: Gastroenterology | Admitting: Gastroenterology

## 2021-11-09 ENCOUNTER — Encounter
Admission: RE | Disposition: A | Discharge status: Home / Self Care | Payer: Self-pay | Source: Home / Self Care | Attending: Gastroenterology

## 2021-11-09 DIAGNOSIS — K5730 Diverticulosis of large intestine without perforation or abscess without bleeding: Secondary | ICD-10-CM | POA: Insufficient documentation

## 2021-11-09 DIAGNOSIS — D122 Benign neoplasm of ascending colon: Secondary | ICD-10-CM | POA: Insufficient documentation

## 2021-11-09 DIAGNOSIS — Z87891 Personal history of nicotine dependence: Secondary | ICD-10-CM | POA: Diagnosis not present

## 2021-11-09 DIAGNOSIS — I252 Old myocardial infarction: Secondary | ICD-10-CM | POA: Insufficient documentation

## 2021-11-09 DIAGNOSIS — E119 Type 2 diabetes mellitus without complications: Secondary | ICD-10-CM | POA: Diagnosis not present

## 2021-11-09 DIAGNOSIS — I10 Essential (primary) hypertension: Secondary | ICD-10-CM | POA: Diagnosis not present

## 2021-11-09 DIAGNOSIS — Z1211 Encounter for screening for malignant neoplasm of colon: Secondary | ICD-10-CM | POA: Diagnosis not present

## 2021-11-09 DIAGNOSIS — D125 Benign neoplasm of sigmoid colon: Secondary | ICD-10-CM | POA: Diagnosis not present

## 2021-11-09 DIAGNOSIS — K640 First degree hemorrhoids: Secondary | ICD-10-CM | POA: Insufficient documentation

## 2021-11-09 DIAGNOSIS — K635 Polyp of colon: Secondary | ICD-10-CM

## 2021-11-09 DIAGNOSIS — Z7984 Long term (current) use of oral hypoglycemic drugs: Secondary | ICD-10-CM | POA: Diagnosis not present

## 2021-11-09 HISTORY — PX: POLYPECTOMY: SHX5525

## 2021-11-09 HISTORY — PX: COLONOSCOPY WITH PROPOFOL: SHX5780

## 2021-11-09 LAB — GLUCOSE, CAPILLARY: Glucose-Capillary: 118 mg/dL — ABNORMAL HIGH (ref 70–99)

## 2021-11-09 SURGERY — COLONOSCOPY WITH PROPOFOL
Anesthesia: General | Site: Rectum

## 2021-11-09 MED ORDER — LIDOCAINE HCL (CARDIAC) PF 100 MG/5ML IV SOSY
PREFILLED_SYRINGE | INTRAVENOUS | Status: DC | PRN
  Administered 2021-11-09: 60 mg via INTRAVENOUS

## 2021-11-09 MED ORDER — SODIUM CHLORIDE 0.9 % IV SOLN: INTRAVENOUS | Status: DC

## 2021-11-09 MED ORDER — STERILE WATER FOR IRRIGATION IR SOLN
Status: DC | PRN
  Administered 2021-11-09: 100 mL

## 2021-11-09 MED ORDER — LACTATED RINGERS IV SOLN: INTRAVENOUS | Status: DC

## 2021-11-09 MED ORDER — PROPOFOL 10 MG/ML IV BOLUS
INTRAVENOUS | Status: DC | PRN
  Administered 2021-11-09 (×3): 20 mg via INTRAVENOUS
  Administered 2021-11-09: 30 mg via INTRAVENOUS
  Administered 2021-11-09 (×2): 20 mg via INTRAVENOUS
  Administered 2021-11-09: 50 mg via INTRAVENOUS
  Administered 2021-11-09: 30 mg via INTRAVENOUS

## 2021-11-09 SURGICAL SUPPLY — 7 items
FORCEPS BIOP RAD 4 LRG CAP 4 (CUTTING FORCEPS) IMPLANT
GOWN CVR UNV OPN BCK APRN NK (MISCELLANEOUS) ×2 IMPLANT
GOWN ISOL THUMB LOOP REG UNIV (MISCELLANEOUS) ×2
KIT PRC NS LF DISP ENDO (KITS) ×1 IMPLANT
KIT PROCEDURE OLYMPUS (KITS) ×1
MANIFOLD NEPTUNE II (INSTRUMENTS) ×1 IMPLANT
WATER STERILE IRR 250ML POUR (IV SOLUTION) ×1 IMPLANT

## 2021-11-09 NOTE — Transfer of Care (Signed)
Immediate Anesthesia Transfer of Care Note  Patient: Holly Cross  Procedure(s) Performed: COLONOSCOPY WITH PROPOFOL (Rectum) POLYPECTOMY (Rectum)  Patient Location: PACU  Anesthesia Type: No value filed.  Level of Consciousness: awake, alert  and patient cooperative  Airway and Oxygen Therapy: Patient Spontanous Breathing and Patient connected to supplemental oxygen  Post-op Assessment: Post-op Vital signs reviewed, Patient's Cardiovascular Status Stable, Respiratory Function Stable, Patent Airway and No signs of Nausea or vomiting  Post-op Vital Signs: Reviewed and stable  Complications: No notable events documented.

## 2021-11-09 NOTE — Anesthesia Preprocedure Evaluation (Signed)
Anesthesia Evaluation  Patient identified by MRN, date of birth, ID band Patient awake    Reviewed: Allergy & Precautions, NPO status , Patient's Chart, lab work & pertinent test results  History of Anesthesia Complications Negative for: history of anesthetic complications  Airway Mallampati: II  TM Distance: >3 FB Neck ROM: Full    Dental no notable dental hx. (+) Teeth Intact   Pulmonary neg pulmonary ROS, neg sleep apnea, neg COPD, Patient abstained from smoking.Not current smoker, former smoker,    Pulmonary exam normal breath sounds clear to auscultation       Cardiovascular Exercise Tolerance: Good METShypertension, + Past MI and + Cardiac Stents  (-) CAD (-) dysrhythmias  Rhythm:Regular Rate:Normal - Systolic murmurs    Neuro/Psych negative neurological ROS  negative psych ROS   GI/Hepatic neg GERD  ,(+)     (-) substance abuse  ,   Endo/Other  diabetes  Renal/GU negative Renal ROS     Musculoskeletal   Abdominal   Peds  Hematology   Anesthesia Other Findings Past Medical History: No date: Diabetes mellitus without complication ([place]) No date: Hypertension 03/2016: Myocardial infarction [facility])  Reproductive/Obstetrics                             Anesthesia Physical Anesthesia Plan  ASA: 3  Anesthesia Plan: General   Post-op Pain Management: Minimal or no pain anticipated   Induction: Intravenous  PONV Risk Score and Plan: 3 and Propofol infusion, TIVA and Ondansetron  Airway Management Planned: Nasal Cannula  Additional Equipment: None  Intra-op Plan:   Post-operative Plan:   Informed Consent: I have reviewed the patients History and Physical, chart, labs and discussed the procedure including the risks, benefits and alternatives for the proposed anesthesia with the patient or authorized representative who has indicated his/her understanding and acceptance.      Dental advisory given  Plan Discussed with: CRNA and Surgeon  Anesthesia Plan Comments: (Discussed risks of anesthesia with patient, including possibility of difficulty with spontaneous ventilation under anesthesia necessitating airway intervention, PONV, and rare risks such as cardiac or respiratory or neurological events, and allergic reactions. Discussed the role of CRNA in patient's perioperative care. Patient understands.)        Anesthesia Quick Evaluation

## 2021-11-09 NOTE — Op Note (Addendum)
[facility] Gastroenterology Patient Name: Holly Cross Procedure Date: 11/09/2021 8:05 AM MRN: [id number] Account #: [id number] Date of Birth: [birth year] Admit Type: Outpatient Age: 61 Room: [facility] OR ROOM 01 Gender: Female Note Status: Finalized Instrument Name: [id number] Procedure:             Colonoscopy Indications:           Screening for colorectal malignant neoplasm Providers:             Lucilla Lame MD, MD Referring MD:          Lynnell Jude (Referring MD) Medicines:             Propofol per Anesthesia Complications:         No immediate complications. Procedure:             Pre-Anesthesia Assessment:                        - Prior to the procedure, a History and Physical was                         performed, and patient medications and allergies were                         reviewed. The patient's tolerance of previous                         anesthesia was also reviewed. The risks and benefits                         of the procedure and the sedation options and risks                         were discussed with the patient. All questions were                         answered, and informed consent was obtained. Prior                         Anticoagulants: The patient has taken no anticoagulant                         or antiplatelet agents. ASA Grade Assessment: II - A                         patient with mild systemic disease. After reviewing                         the risks and benefits, the patient was deemed in                         satisfactory condition to undergo the procedure.                        After obtaining informed consent, the colonoscope was                         passed under direct vision. Throughout the procedure,  the patient's blood pressure, pulse, and oxygen                         saturations were monitored continuously. The                         Colonoscope was introduced through the anus and                          advanced to the the cecum, identified by appendiceal                         orifice and ileocecal valve. The colonoscopy was                         performed without difficulty. The patient tolerated                         the procedure well. The quality of the bowel                         preparation was excellent. Findings:      The perianal and digital rectal examinations were normal.      Two sessile polyps were found in the sigmoid colon. The polyps were 5 to       6 mm in size. These polyps were removed with a cold snare. Resection and       retrieval were complete.      Two sessile polyps were found in the sigmoid colon. The polyps were 3 to       4 mm in size. These polyps were removed with a cold biopsy forceps.       Resection and retrieval were complete.      A 3 mm polyp was found in the ascending colon. The polyp was sessile.       The polyp was removed with a cold biopsy forceps. Resection and       retrieval were complete.      Non-bleeding internal hemorrhoids were found during retroflexion. The       hemorrhoids were Grade I (internal hemorrhoids that do not prolapse).      Multiple small-mouthed diverticula were found in the sigmoid colon. Impression:            - Two 5 to 6 mm polyps in the sigmoid colon, removed                         with a cold snare. Resected and retrieved.                        - Two 3 to 4 mm polyps in the sigmoid colon, removed                         with a cold biopsy forceps. Resected and retrieved.                        - One 3 mm polyp in the ascending colon, removed with                         a  cold biopsy forceps. Resected and retrieved.                        - Non-bleeding internal hemorrhoids.                        - Diverticulosis in the sigmoid colon. Recommendation:        - Discharge patient to home.                        - Resume previous diet.                        - Continue present  medications.                        - Await pathology results.                        - If the pathology report reveals adenomatous tissue,                         then repeat the colonoscopy for surveillance in 7                         years otherwise 10 years. Procedure Code(s):     --- Professional ---                        [id number], Colonoscopy, flexible; with removal of                         tumor(s), polyp(s), or other lesion(s) by snare                         technique                        45380, 49, Colonoscopy, flexible; with biopsy, single                         or multiple Diagnosis Code(s):     --- Professional ---                        Z12.11, Encounter for screening for malignant neoplasm                         of colon                        D12.5, Benign neoplasm of sigmoid colon                        D12.2, Benign neoplasm of ascending colon CPT copyright 2022 American Medical Association. All rights reserved. The codes documented in this report are preliminary and upon coder review may  be revised to meet current compliance requirements. Lucilla Lame MD, MD 11/09/2021 8:37:42 AM This report has been signed electronically. Number of Addenda: 0 Note Initiated On: 11/09/2021 8:05 AM Scope Withdrawal Time: 0 hours 13 minutes 14 seconds  Total Procedure Duration: 0 hours 16 minutes 19 seconds  Estimated Blood Loss:  Estimated blood  loss: none.      [facility]

## 2021-11-09 NOTE — Anesthesia Postprocedure Evaluation (Signed)
Anesthesia Post Note  Patient: Holly Cross  Procedure(s) Performed: COLONOSCOPY WITH PROPOFOL (Rectum) POLYPECTOMY (Rectum)  Patient location during evaluation: PACU Anesthesia Type: General Level of consciousness: awake and alert Pain management: pain level controlled Vital Signs Assessment: post-procedure vital signs reviewed and stable Respiratory status: spontaneous breathing, nonlabored ventilation, respiratory function stable and patient connected to nasal cannula oxygen Cardiovascular status: blood pressure returned to baseline and stable Postop Assessment: no apparent nausea or vomiting Anesthetic complications: no   No notable events documented.   Last Vitals:  Vitals:   11/09/21 0839 11/09/21 0847  BP: 113/86 109/80  Pulse: 79 69  Resp: 14 (!) 24  Temp: 36.6 C 36.6 C  SpO2: 97% 100%    Last Pain:  Vitals:   11/09/21 0847  PainSc: 0-No pain                 Arita Miss

## 2021-11-09 NOTE — H&P (Signed)
Lucilla Lame, MD [address] [place], [place] [address] Phone: [phone] Fax : [phone]  Primary Care Physician:  Lynnell Jude, MD Primary Gastroenterologist:  Dr. Allen Norris  Pre-Procedure History & Physical: HPI:  Holly Cross is a 61 y.o. female is here for a screening colonoscopy.   Past Medical History:  Diagnosis Date   Diabetes mellitus without complication ([place])    Hypertension    Myocardial infarction ([place]) 03/2016    Past Surgical History:  Procedure Laterality Date   APPENDECTOMY     CARDIAC CATHETERIZATION  03/2016   Stent   CHOLECYSTECTOMY     SHOULDER ARTHROSCOPY W/ ROTATOR CUFF REPAIR Right 01/15/2004    Prior to Admission medications   Medication Sig Start Date End Date Taking? Authorizing Provider  amLODipine (NORVASC) 10 MG tablet Take 10 mg by mouth daily.   Yes [provider]  ASPIRIN 81 PO Take by mouth daily.   Yes [provider]  atorvastatin (LIPITOR) 80 MG tablet Take 80 mg by mouth daily.   Yes [provider]  lisinopril (PRINIVIL,ZESTRIL) 5 MG tablet Take 20 mg by mouth daily.   Yes [provider]  METFORMIN HCL PO Take 500 mg by mouth 2 (two) times daily.    Yes [provider]  metoprolol tartrate (LOPRESSOR) 25 MG tablet Take 25 mg by mouth 2 (two) times daily.   Yes [provider]  nitroGLYCERIN (NITROSTAT) 0.4 MG SL tablet Place 0.4 mg under the tongue every 5 (five) minutes as needed for chest pain.   Yes [provider]  sertraline (ZOLOFT) 50 MG tablet Take 50 mg by mouth daily.   Yes [provider]  ticagrelor (BRILINTA) 90 MG TABS tablet Take 60 mg by mouth 2 (two) times daily.   Yes [provider]  hydrochlorothiazide (HYDRODIURIL) 25 MG tablet Take 25 mg by mouth daily as needed. Patient not taking: Reported on 11/05/2021    [provider]    Allergies as of 09/20/2021   (No Known Allergies)    Family  History  Problem Relation Age of Onset   Diabetes Father    Breast cancer Neg Hx     Social History   Socioeconomic History   Marital status: Married    Spouse name: Not on file   Number of children: Not on file   Years of education: Not on file   Highest education level: Not on file  Occupational History   Not on file  Tobacco Use   Smoking status: Former    Packs/day: 0.50    Years: 30.00    Total pack years: 15.00    Types: Cigarettes    Quit date: 2020    Years since quitting: 3.8   Smokeless tobacco: Never  Vaping Use   Vaping Use: Never used  Substance and Sexual Activity   Alcohol use: Yes    Alcohol/week: 3.0 standard drinks of alcohol    Types: 3 Cans of beer per week   Drug use: Not on file   Sexual activity: Not on file  Other Topics Concern   Not on file  Social History Narrative   Not on file   Social Determinants of Health   Financial Resource Strain: Not on file  Food Insecurity: Not on file  Transportation Needs: Not on file  Physical Activity: Not on file  Stress: Not on file  Social Connections: Not on file  Intimate Partner Violence: Not on file  Review of Systems: See HPI, otherwise negative ROS  Physical Exam: BP 111/81   Pulse 85   Temp (!) 97.4 F (36.3 C)   Resp 15   Ht 5' 3.4" (1.61 m)   Wt 67.1 kg   SpO2 98%   BMI 25.89 kg/m  General:   Alert,  pleasant and cooperative in NAD Head:  Normocephalic and atraumatic. Neck:  Supple; no masses or thyromegaly. Lungs:  Clear throughout to auscultation.    Heart:  Regular rate and rhythm. Abdomen:  Soft, nontender and nondistended. Normal bowel sounds, without guarding, and without rebound.   Neurologic:  Alert and  oriented x4;  grossly normal neurologically.  Impression/Plan: Holly Cross is now here to undergo a screening colonoscopy.  Risks, benefits, and alternatives regarding colonoscopy have been reviewed with the patient.  Questions have been answered.  All  parties agreeable.

## 2021-11-12 ENCOUNTER — Encounter: Payer: Self-pay | Admitting: Gastroenterology

## 2021-11-12 LAB — SURGICAL PATHOLOGY

## 2022-02-03 IMAGING — MG DIGITAL SCREENING BILAT W/ TOMO W/ CAD
8 series · 9 of 24 positions shown · non-contrast
Comparison: Previous exam(s).

CLINICAL DATA: Screening.

EXAM:
DIGITAL SCREENING BILATERAL MAMMOGRAM WITH TOMO AND CAD

[L CC synth-2D]
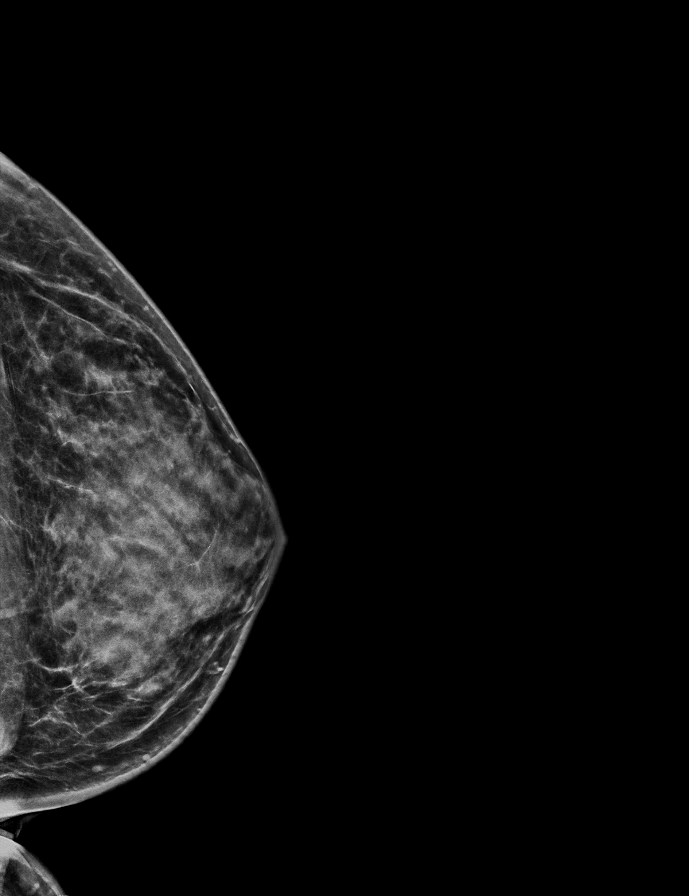

[R CC synth-2D]
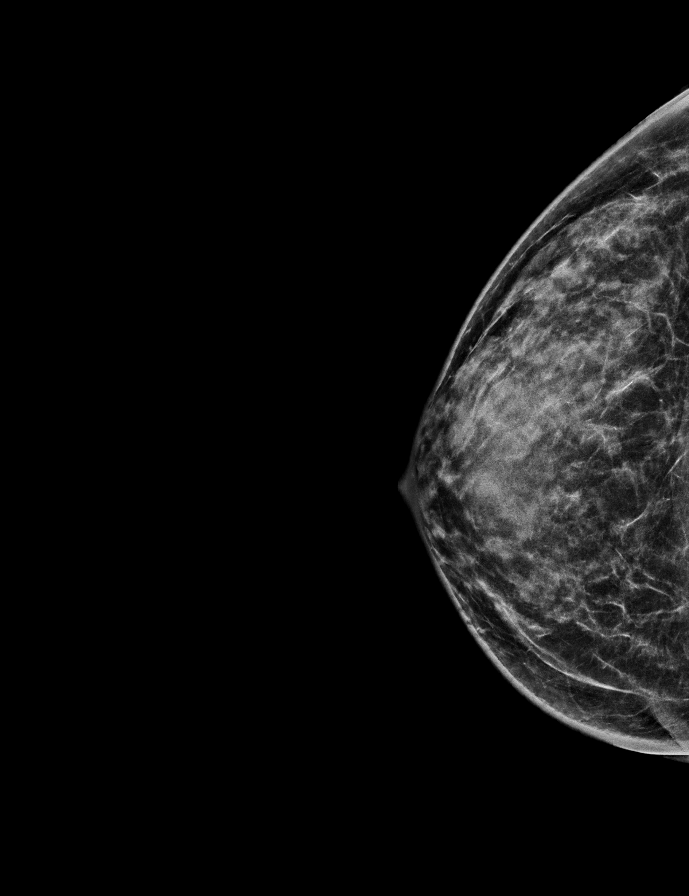

[L MLO synth-2D]
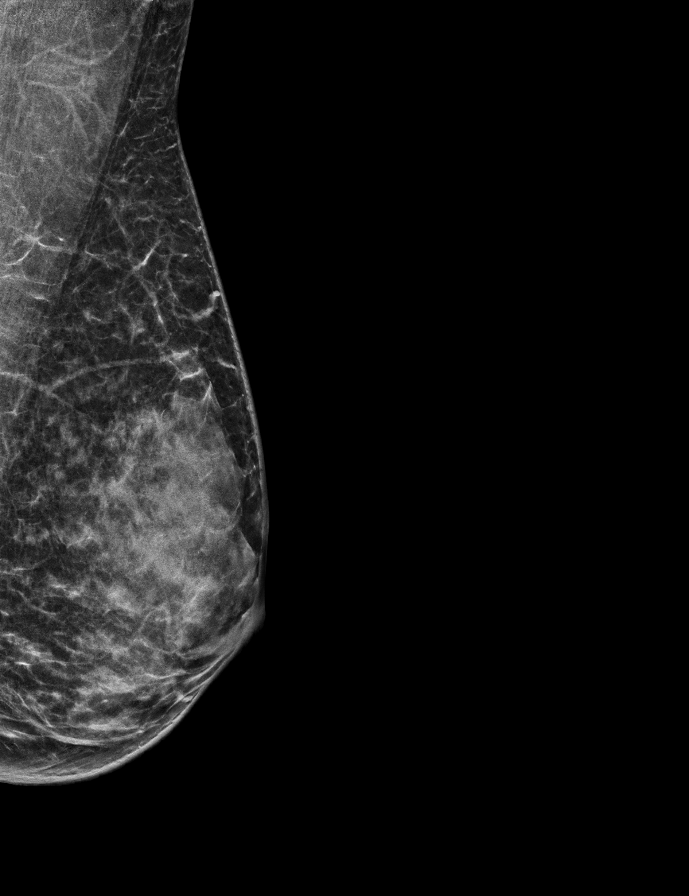

[R MLO synth-2D]
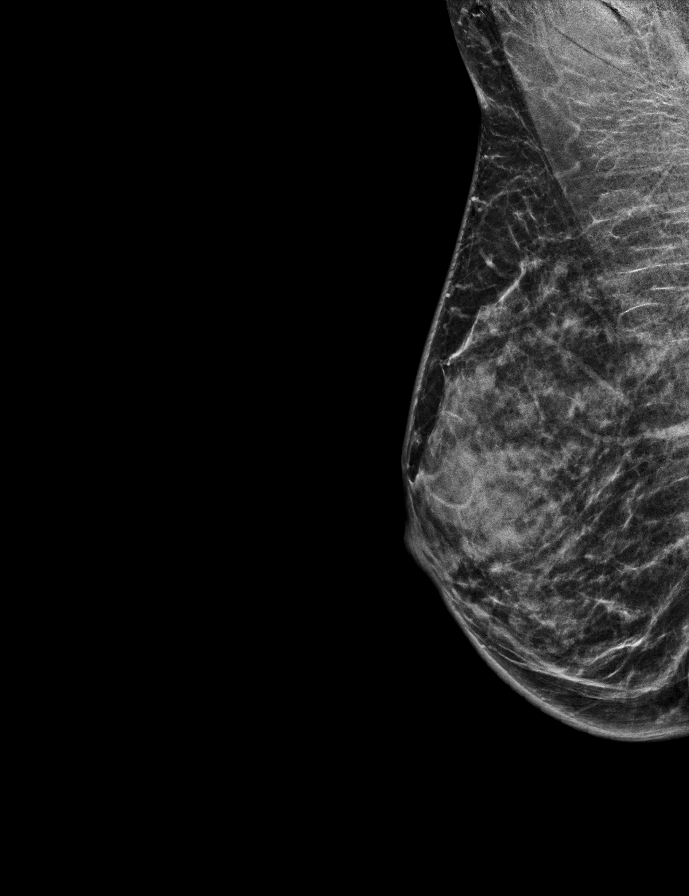

[R MLO tomo · 2 of 45 frames shown]
[frame 15/45]
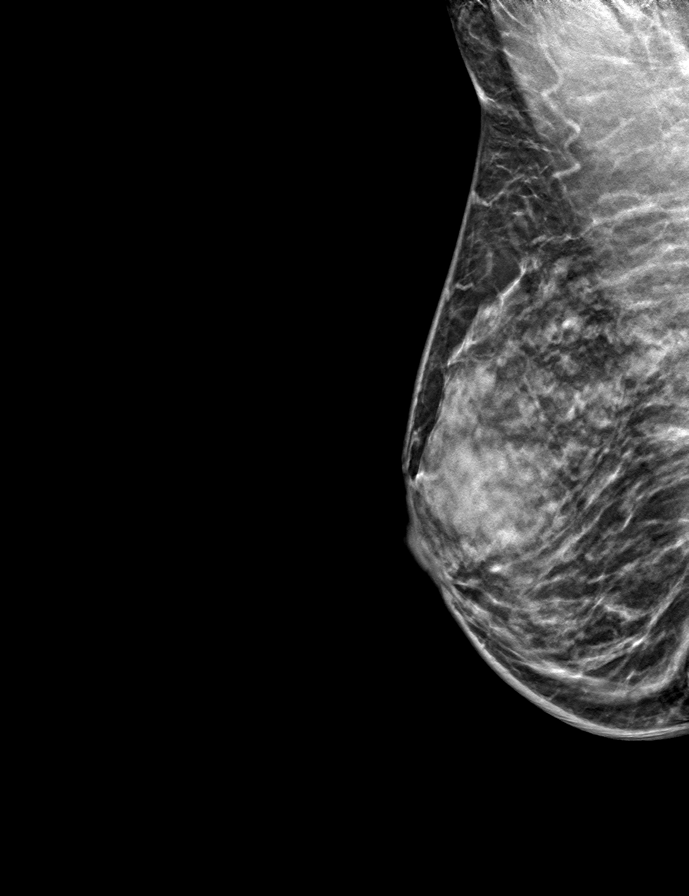
[frame 23/45]
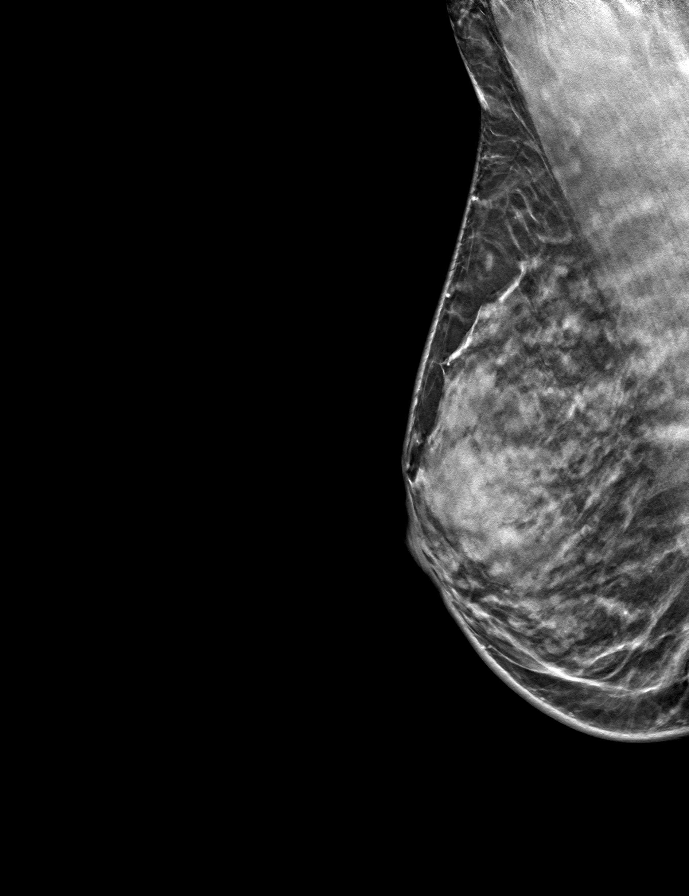

[R CC tomo · tomo slice 23/45.0]
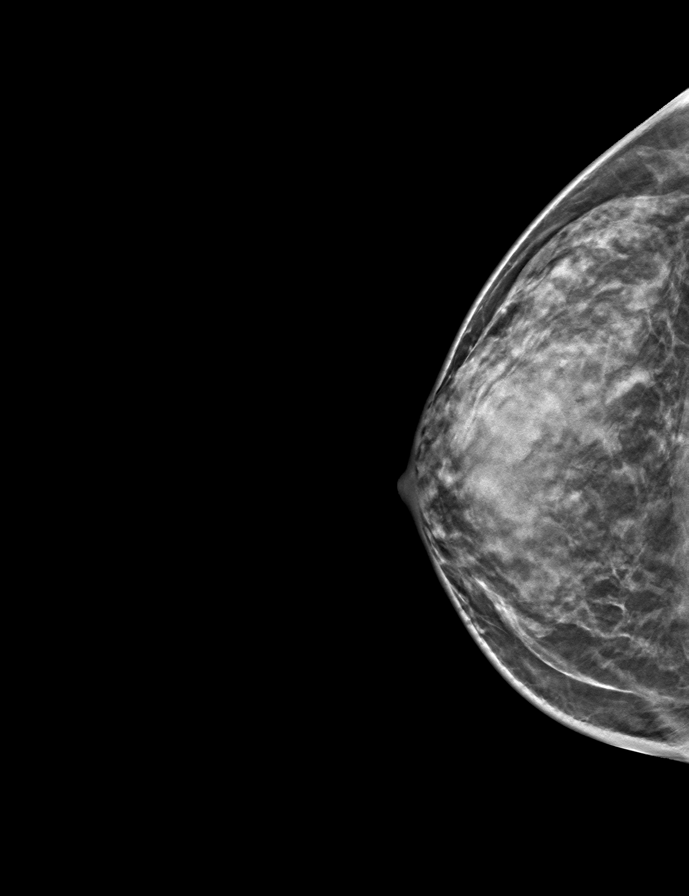

[L MLO tomo · tomo slice 25/48.0]
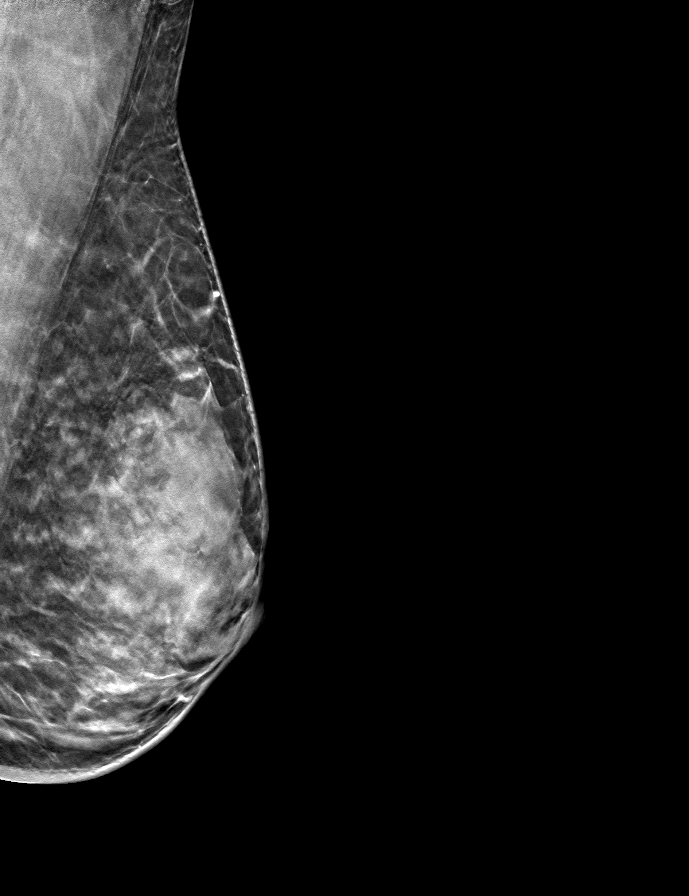

[L CC tomo · tomo slice 26/51.0]
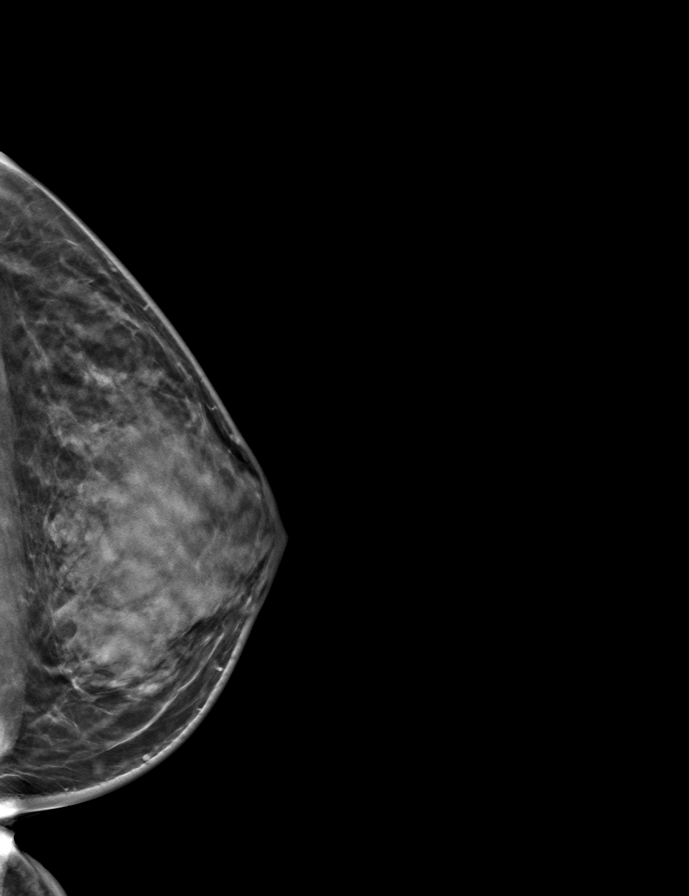

[9 of 24 positions shown; findings below may reference images not displayed]

ACR Breast Density Category d: The breast tissue is extremely dense,
which lowers the sensitivity of mammography
FINDINGS: There are no findings suspicious for malignancy. Images were
processed with CAD.
IMPRESSION: No mammographic evidence of malignancy. A result letter of this
screening mammogram will be mailed directly to the patient.

RECOMMENDATION:
Screening mammogram in one year. (Code:WO-0-ZI0)

BI-RADS CATEGORY  1: Negative.
